# Patient Record
Sex: Female | Born: 1955 | Race: White | Hispanic: No | Marital: Married
Health system: Southern US, Community
[De-identification: ages and names within clinical notes are randomized; demographics above are authoritative.]

## PROBLEM LIST (undated history)

## (undated) DIAGNOSIS — J1282 Pneumonia due to coronavirus disease 2019: Secondary | ICD-10-CM

## (undated) DIAGNOSIS — J80 Acute respiratory distress syndrome: Secondary | ICD-10-CM

## (undated) DIAGNOSIS — U071 COVID-19: Secondary | ICD-10-CM

## (undated) DIAGNOSIS — J9621 Acute and chronic respiratory failure with hypoxia: Secondary | ICD-10-CM

---

## 2020-05-21 ENCOUNTER — Inpatient Hospital Stay
Admission: RE | Admit: 2020-05-21 | Discharge: 2020-07-08 | Disposition: A | Discharge status: Skilled Nursing Facility | Payer: Medicare HMO | Source: Other Acute Inpatient Hospital | Attending: Internal Medicine | Admitting: Internal Medicine

## 2020-05-21 ENCOUNTER — Other Ambulatory Visit (HOSPITAL_COMMUNITY): Payer: Medicare HMO

## 2020-05-21 DIAGNOSIS — Z9911 Dependence on respirator [ventilator] status: Secondary | ICD-10-CM

## 2020-05-21 DIAGNOSIS — R0902 Hypoxemia: Secondary | ICD-10-CM

## 2020-05-21 DIAGNOSIS — J1282 Pneumonia due to coronavirus disease 2019: Secondary | ICD-10-CM | POA: Diagnosis present

## 2020-05-21 DIAGNOSIS — J189 Pneumonia, unspecified organism: Secondary | ICD-10-CM

## 2020-05-21 DIAGNOSIS — J90 Pleural effusion, not elsewhere classified: Secondary | ICD-10-CM

## 2020-05-21 DIAGNOSIS — J9621 Acute and chronic respiratory failure with hypoxia: Secondary | ICD-10-CM | POA: Diagnosis present

## 2020-05-21 DIAGNOSIS — R000 Tachycardia, unspecified: Secondary | ICD-10-CM

## 2020-05-21 DIAGNOSIS — Z4659 Encounter for fitting and adjustment of other gastrointestinal appliance and device: Secondary | ICD-10-CM

## 2020-05-21 DIAGNOSIS — Z931 Gastrostomy status: Secondary | ICD-10-CM

## 2020-05-21 DIAGNOSIS — J9690 Respiratory failure, unspecified, unspecified whether with hypoxia or hypercapnia: Secondary | ICD-10-CM

## 2020-05-21 DIAGNOSIS — Z431 Encounter for attention to gastrostomy: Secondary | ICD-10-CM

## 2020-05-21 DIAGNOSIS — R1310 Dysphagia, unspecified: Secondary | ICD-10-CM

## 2020-05-21 DIAGNOSIS — T85598A Other mechanical complication of other gastrointestinal prosthetic devices, implants and grafts, initial encounter: Secondary | ICD-10-CM

## 2020-05-21 DIAGNOSIS — J80 Acute respiratory distress syndrome: Secondary | ICD-10-CM | POA: Diagnosis present

## 2020-05-21 DIAGNOSIS — U071 COVID-19: Secondary | ICD-10-CM | POA: Diagnosis present

## 2020-05-21 DIAGNOSIS — Z930 Tracheostomy status: Secondary | ICD-10-CM

## 2020-05-21 HISTORY — DX: Acute and chronic respiratory failure with hypoxia: J96.21

## 2020-05-21 HISTORY — DX: Acute respiratory distress syndrome: J80

## 2020-05-21 HISTORY — DX: COVID-19: U07.1

## 2020-05-21 HISTORY — DX: Pneumonia due to coronavirus disease 2019: J12.82

## 2020-05-21 LAB — URINALYSIS, ROUTINE W REFLEX MICROSCOPIC
Bacteria, UA: NONE SEEN
Bilirubin Urine: NEGATIVE
Glucose, UA: NEGATIVE mg/dL
Ketones, ur: NEGATIVE mg/dL
Leukocytes,Ua: NEGATIVE
Nitrite: NEGATIVE
Protein, ur: NEGATIVE mg/dL
Specific Gravity, Urine: 1.012 (ref 1.005–1.030)
pH: 7 (ref 5.0–8.0)

## 2020-05-21 LAB — BLOOD GAS, ARTERIAL
Acid-Base Excess: 9.5 mmol/L — ABNORMAL HIGH (ref 0.0–2.0)
Bicarbonate: 34.5 mmol/L — ABNORMAL HIGH (ref 20.0–28.0)
FIO2: 50
O2 Saturation: 97.8 %
Patient temperature: 36.6
pCO2 arterial: 54.7 mmHg — ABNORMAL HIGH (ref 32.0–48.0)
pH, Arterial: 7.413 (ref 7.350–7.450)
pO2, Arterial: 96.9 mmHg (ref 83.0–108.0)

## 2020-05-21 LAB — TSH: TSH: 2.544 u[IU]/mL (ref 0.350–4.500)

## 2020-05-21 MED ORDER — IOHEXOL 300 MG/ML  SOLN
50.0000 mL | Freq: Once | INTRAMUSCULAR | Status: AC | PRN
  Administered 2020-05-21: 30 mL

## 2020-05-22 DIAGNOSIS — Z9911 Dependence on respirator [ventilator] status: Secondary | ICD-10-CM | POA: Diagnosis not present

## 2020-05-22 DIAGNOSIS — U071 COVID-19: Secondary | ICD-10-CM | POA: Diagnosis not present

## 2020-05-22 DIAGNOSIS — J1282 Pneumonia due to coronavirus disease 2019: Secondary | ICD-10-CM

## 2020-05-22 DIAGNOSIS — J80 Acute respiratory distress syndrome: Secondary | ICD-10-CM

## 2020-05-22 DIAGNOSIS — J9621 Acute and chronic respiratory failure with hypoxia: Secondary | ICD-10-CM | POA: Diagnosis not present

## 2020-05-22 LAB — CBC WITH DIFFERENTIAL/PLATELET
Abs Immature Granulocytes: 0.05 10*3/uL (ref 0.00–0.07)
Basophils Absolute: 0.1 10*3/uL (ref 0.0–0.1)
Basophils Relative: 1 %
Eosinophils Absolute: 1.1 10*3/uL — ABNORMAL HIGH (ref 0.0–0.5)
Eosinophils Relative: 12 %
HCT: 32.8 % — ABNORMAL LOW (ref 36.0–46.0)
Hemoglobin: 10.1 g/dL — ABNORMAL LOW (ref 12.0–15.0)
Immature Granulocytes: 1 %
Lymphocytes Relative: 12 %
Lymphs Abs: 1.1 10*3/uL (ref 0.7–4.0)
MCH: 28.9 pg (ref 26.0–34.0)
MCHC: 30.8 g/dL (ref 30.0–36.0)
MCV: 93.7 fL (ref 80.0–100.0)
Monocytes Absolute: 0.5 10*3/uL (ref 0.1–1.0)
Monocytes Relative: 6 %
Neutro Abs: 6 10*3/uL (ref 1.7–7.7)
Neutrophils Relative %: 68 %
Platelets: 309 10*3/uL (ref 150–400)
RBC: 3.5 MIL/uL — ABNORMAL LOW (ref 3.87–5.11)
RDW: 17.2 % — ABNORMAL HIGH (ref 11.5–15.5)
WBC: 8.7 10*3/uL (ref 4.0–10.5)
nRBC: 0 % (ref 0.0–0.2)

## 2020-05-22 LAB — BLOOD GAS, ARTERIAL
Acid-Base Excess: 10.6 mmol/L — ABNORMAL HIGH (ref 0.0–2.0)
Bicarbonate: 36.5 mmol/L — ABNORMAL HIGH (ref 20.0–28.0)
FIO2: 50
O2 Saturation: 97.6 %
Patient temperature: 36.5
pCO2 arterial: 67.4 mmHg (ref 32.0–48.0)
pH, Arterial: 7.35 (ref 7.350–7.450)
pO2, Arterial: 95.1 mmHg (ref 83.0–108.0)

## 2020-05-22 LAB — COMPREHENSIVE METABOLIC PANEL
ALT: 34 U/L (ref 0–44)
AST: 28 U/L (ref 15–41)
Albumin: 2.5 g/dL — ABNORMAL LOW (ref 3.5–5.0)
Alkaline Phosphatase: 78 U/L (ref 38–126)
Anion gap: 9 (ref 5–15)
BUN: 16 mg/dL (ref 8–23)
CO2: 36 mmol/L — ABNORMAL HIGH (ref 22–32)
Calcium: 9.6 mg/dL (ref 8.9–10.3)
Chloride: 95 mmol/L — ABNORMAL LOW (ref 98–111)
Creatinine, Ser: 0.51 mg/dL (ref 0.44–1.00)
GFR, Estimated: >60 mL/min (ref >60)
Glucose, Bld: 129 mg/dL — ABNORMAL HIGH (ref 70–99)
Potassium: 4.2 mmol/L (ref 3.5–5.1)
Sodium: 140 mmol/L (ref 135–145)
Total Bilirubin: 0.6 mg/dL (ref 0.3–1.2)
Total Protein: 6.6 g/dL (ref 6.5–8.1)

## 2020-05-22 LAB — HEMOGLOBIN A1C
Hgb A1c MFr Bld: 4.9 % (ref 4.8–5.6)
Mean Plasma Glucose: 93.93 mg/dL

## 2020-05-22 LAB — PROTIME-INR
INR: 1.2 (ref 0.8–1.2)
Prothrombin Time: 14.7 seconds (ref 11.4–15.2)

## 2020-05-22 LAB — MAGNESIUM: Magnesium: 2 mg/dL (ref 1.7–2.4)

## 2020-05-22 LAB — PHOSPHORUS: Phosphorus: 4.7 mg/dL — ABNORMAL HIGH (ref 2.5–4.6)

## 2020-05-22 NOTE — Consult Note (Signed)
Pulmonary Critical Care Medicine [facility] [place]  PULMONARY SERVICE  Date of Service: 05/22/2020  PULMONARY CRITICAL CARE CONSULT   Andrea Fuller  [id number]:[id number]  DOB: 1955-11-15   DOA: 05/21/2020  Referring Physician: Carron Curie, MD  HPI: Andrea Fuller is a 65 y.o. female seen for follow up of Acute on Chronic Respiratory Failure.  Patient with multiple medical problems including hypertension came into the hospital because of cough and shortness of breath increasing shortness of breath with fevers and aches and pains.  Patient was apparently diagnosed with COVID-19 and became worse with development of ARDS.  Had a complicated course with worsening overall respiratory status ended up having to be intubated placed on mechanical ventilation subsequently was not able to come off of the ventilator and had to have a tracheostomy done.  Patient also developed critical care myopathy and has been significantly weak.  Transferred to our facility now for further management and weaning  Review of Systems:  ROS performed and is unremarkable other than noted above.  Diagnosis  . Primary osteoarthritis of knees, bilateral  . Morbid obesity (HCC)  . Essential hypertension  . Recurrent major depressive disorder, in partial remission (HCC)  . Chronic pain of both knees  . Class 3 severe obesity with body mass index (BMI) of 45.0 to 49.9 in adult [facility])  . Colonoscopy refused  . S/P hysterectomy for benign  . Mixed hyperlipidemia  . Pneumonia due to COVID-19 virus   Past Surgical History:  Procedure Laterality Date  . HYSTERECTOMY  . TUBAL LIGATION  . WRIST FRACTURE SURGERY   Family History Family History  Problem Relation Age of Onset  . Cancer Father  neuroendocrine carcinoma  . Anesthesia problems Neg Hx  . Arthritis Neg Hx  . Asthma Neg Hx   Socioeconomic History  . Marital status: Married  Spouse name: Not on file  . Number of children: Not on file  . Years of  education: Not on file  . Highest education level: Not on file  Occupational History  . Not on file  Tobacco Use  . Smoking status: Former Smoker  Quit date: 1985  Years since quitting: 36.9  . Smokeless tobacco: Never Used      Medications: Reviewed on Rounds  Physical Exam:  Vitals: Temperature is 97.8 pulse 70 respiratory 29 blood pressure 153/69 saturations 100%  Ventilator Settings on pressure control FiO2 50% IP 14 PEEP 5  . General: Comfortable at this time . Eyes: Grossly normal lids, irises & conjunctiva . ENT: grossly tongue is normal . Neck: no obvious mass . Cardiovascular: S1-S2 normal no gallop or rub . Respiratory: Scattered coarse rhonchi expansion equal . Abdomen: Soft and nontender . Skin: no rash seen on limited exam . Musculoskeletal: not rigid . Psychiatric:unable to assess . Neurologic: no seizure no involuntary movements         Labs on Admission:  Basic Metabolic Panel: Recent Labs  Lab 05/22/20 0557  NA 140  K 4.2  CL 95*  CO2 36*  GLUCOSE 129*  BUN 16  CREATININE 0.51  CALCIUM 9.6  MG 2.0  PHOS 4.7*    Recent Labs  Lab 05/21/20 1320 05/22/20 1315  PHART 7.413 7.350  PCO2ART 54.7* 67.4*  PO2ART 96.9 95.1  HCO3 34.5* 36.5*  O2SAT 97.8 97.6    Liver Function Tests: Recent Labs  Lab 05/22/20 0557  AST 28  ALT 34  ALKPHOS 78  BILITOT 0.6  PROT 6.6  ALBUMIN 2.5*   No  results for input(s): LIPASE, AMYLASE in the last 168 hours. No results for input(s): AMMONIA in the last 168 hours.  CBC: Recent Labs  Lab 05/22/20 0557  WBC 8.7  NEUTROABS 6.0  HGB 10.1*  HCT 32.8*  MCV 93.7  PLT 309    Cardiac Enzymes: No results for input(s): CKTOTAL, CKMB, CKMBINDEX, TROPONINI in the last 168 hours.  BNP (last 3 results) No results for input(s): BNP in the last 8760 hours.  ProBNP (last 3 results) No results for input(s): PROBNP in the last 8760 hours.   Radiological Exams on Admission: DG ABDOMEN PEG TUBE  LOCATION  Result Date: 05/21/2020 CLINICAL DATA:  Confirm percutaneous gastrostomy tube placement. EXAM: ABDOMEN - 1 VIEW 30 cc of Omnipaque 300 was administered per tube. COMPARISON:  None. FINDINGS: Percutaneous gastrostomy tube with retention balloon in the stomach. There is radiopaque contrast visualized in the stomach and proximal duodenum. The bowel gas pattern is normal. Thoracolumbar spondylosis. IMPRESSION: 1. Percutaneous gastrostomy tube within the stomach. 2. No evidence of bowel obstruction. Electronically Signed   By: Maudry Mayhew MD   On: 05/21/2020 14:40   DG CHEST PORT 1 VIEW  Result Date: 05/21/2020 CLINICAL DATA:  Tracheostomy, history of COVID EXAM: PORTABLE CHEST 1 VIEW COMPARISON:  05/01/2020 chest radiograph. FINDINGS: Tracheostomy tube tip overlies the tracheal air column 3.3 cm above the carina. Stable cardiomediastinal silhouette with normal heart size. No pneumothorax. No pleural effusion. Extensive patchy opacities throughout both lungs, slightly improved on the right and similar to slightly worsened on the left. IMPRESSION: 1. Well-positioned tracheostomy tube. 2. Extensive patchy opacities throughout both lungs, slightly improved on the right and similar to slightly worsened on the left. Findings presumably represent persistent sequela of severe COVID-19 infection. Electronically Signed   By: Delbert Phenix M.D.   On: 05/21/2020 14:45    Assessment/Plan Active Problems:   Acute on chronic respiratory failure with hypoxia (HCC)   COVID-19 virus infection   Pneumonia due to COVID-19 virus   Acute respiratory distress syndrome (ARDS) due to COVID-19 virus (HCC)   1. Acute on chronic respiratory failure hypoxia patient continues to be on the ventilator at this time right now is on pressure control mode requiring 50% FiO2.  Spoke with respiratory therapy to try to wean FiO2 down and reassess weaning potential. 2. COVID-19 virus infection in recovery we will continue with  supportive care.  Follow-up x-rays reveal bilateral interstitial infiltrates 3. COVID-19 pneumonia we will continue to follow patient has been treated for healthcare associated infections also.  We will do follow-up x-rays as warranted. 4. ARDS secondary to COVID-19 patient will need mechanical ventilation based on ARDS physiology.  We are hopeful that we can make attempts at weaning while patient is here but prognosis remains guarded  I have personally seen and evaluated the patient, evaluated laboratory and imaging results, formulated the assessment and plan and placed orders. The Patient requires high complexity decision making with multiple systems involvement.  Case was discussed on Rounds with the Respiratory Therapy Director and the Respiratory staff Time Spent  Yevonne Pax, MD [facility] Pulmonary Critical Care Medicine Sleep Medicine

## 2020-05-23 ENCOUNTER — Encounter: Payer: Self-pay | Admitting: Internal Medicine

## 2020-05-23 DIAGNOSIS — U071 COVID-19: Secondary | ICD-10-CM | POA: Diagnosis present

## 2020-05-23 DIAGNOSIS — J80 Acute respiratory distress syndrome: Secondary | ICD-10-CM | POA: Diagnosis not present

## 2020-05-23 DIAGNOSIS — Z9911 Dependence on respirator [ventilator] status: Secondary | ICD-10-CM | POA: Diagnosis not present

## 2020-05-23 DIAGNOSIS — J9621 Acute and chronic respiratory failure with hypoxia: Secondary | ICD-10-CM | POA: Diagnosis present

## 2020-05-23 DIAGNOSIS — J1282 Pneumonia due to coronavirus disease 2019: Secondary | ICD-10-CM | POA: Diagnosis present

## 2020-05-23 LAB — MAGNESIUM: Magnesium: 1.9 mg/dL (ref 1.7–2.4)

## 2020-05-23 LAB — CBC
HCT: 30.2 % — ABNORMAL LOW (ref 36.0–46.0)
Hemoglobin: 9.2 g/dL — ABNORMAL LOW (ref 12.0–15.0)
MCH: 28.6 pg (ref 26.0–34.0)
MCHC: 30.5 g/dL (ref 30.0–36.0)
MCV: 93.8 fL (ref 80.0–100.0)
Platelets: 303 10*3/uL (ref 150–400)
RBC: 3.22 MIL/uL — ABNORMAL LOW (ref 3.87–5.11)
RDW: 17.2 % — ABNORMAL HIGH (ref 11.5–15.5)
WBC: 9.9 10*3/uL (ref 4.0–10.5)
nRBC: 0 % (ref 0.0–0.2)

## 2020-05-23 LAB — BASIC METABOLIC PANEL
Anion gap: 11 (ref 5–15)
BUN: 17 mg/dL (ref 8–23)
CO2: 34 mmol/L — ABNORMAL HIGH (ref 22–32)
Calcium: 9.5 mg/dL (ref 8.9–10.3)
Chloride: 94 mmol/L — ABNORMAL LOW (ref 98–111)
Creatinine, Ser: 0.48 mg/dL (ref 0.44–1.00)
GFR, Estimated: >60 mL/min (ref >60)
Glucose, Bld: 121 mg/dL — ABNORMAL HIGH (ref 70–99)
Potassium: 4.3 mmol/L (ref 3.5–5.1)
Sodium: 139 mmol/L (ref 135–145)

## 2020-05-23 LAB — BLOOD GAS, ARTERIAL
Acid-Base Excess: 13.4 mmol/L — ABNORMAL HIGH (ref 0.0–2.0)
Bicarbonate: 38.8 mmol/L — ABNORMAL HIGH (ref 20.0–28.0)
Drawn by: 34762
FIO2: 50
O2 Saturation: 98.8 %
Patient temperature: 37.1
pCO2 arterial: 63.9 mmHg — ABNORMAL HIGH (ref 32.0–48.0)
pH, Arterial: 7.401 (ref 7.350–7.450)
pO2, Arterial: 116 mmHg — ABNORMAL HIGH (ref 83.0–108.0)

## 2020-05-23 LAB — PHOSPHORUS: Phosphorus: 4.1 mg/dL (ref 2.5–4.6)

## 2020-05-23 LAB — URINE CULTURE: Culture: <10000 — AB

## 2020-05-23 NOTE — Progress Notes (Signed)
Pulmonary Critical Care Medicine [facility] [place]   PULMONARY CRITICAL CARE SERVICE  PROGRESS NOTE  Date of Service: 05/23/2020  Andrea Fuller  QQP:[id number]  DOB: Sep 19, 1955   DOA: 05/21/2020  Referring Physician: Carron Curie, MD  HPI: Andrea Fuller is a 65 y.o. female seen for follow up of Acute on Chronic Respiratory Failure.  Patient currently is on pressure control mode has been on 50% FiO2 with a iP of 14  Medications: Reviewed on Rounds  Physical Exam:  Vitals: Temperature 98.9 pulse 64 respiratory rate 30 blood pressure is 108/42 saturations 96%  Ventilator Settings on pressure assist control FiO2 50% IP 14 PEEP 5  . General: Comfortable at this time . Eyes: Grossly normal lids, irises & conjunctiva . ENT: grossly tongue is normal . Neck: no obvious mass . Cardiovascular: S1 S2 normal no gallop . Respiratory: No rhonchi coarse breath sounds . Abdomen: soft . Skin: no rash seen on limited exam . Musculoskeletal: not rigid . Psychiatric:unable to assess . Neurologic: no seizure no involuntary movements         Lab Data:   Basic Metabolic Panel: Recent Labs  Lab 05/22/20 0557 05/23/20 0614  NA 140 139  K 4.2 4.3  CL 95* 94*  CO2 36* 34*  GLUCOSE 129* 121*  BUN 16 17  CREATININE 0.51 0.48  CALCIUM 9.6 9.5  MG 2.0 1.9  PHOS 4.7* 4.1    ABG: Recent Labs  Lab 05/21/20 1320 05/22/20 1315 05/23/20 0601  PHART 7.413 7.350 7.401  PCO2ART 54.7* 67.4* 63.9*  PO2ART 96.9 95.1 116*  HCO3 34.5* 36.5* 38.8*  O2SAT 97.8 97.6 98.8    Liver Function Tests: Recent Labs  Lab 05/22/20 0557  AST 28  ALT 34  ALKPHOS 78  BILITOT 0.6  PROT 6.6  ALBUMIN 2.5*   No results for input(s): LIPASE, AMYLASE in the last 168 hours. No results for input(s): AMMONIA in the last 168 hours.  CBC: Recent Labs  Lab 05/22/20 0557 05/23/20 0614  WBC 8.7 9.9  NEUTROABS 6.0  --   HGB 10.1* 9.2*  HCT 32.8* 30.2*  MCV 93.7 93.8  PLT 309 303    Cardiac  Enzymes: No results for input(s): CKTOTAL, CKMB, CKMBINDEX, TROPONINI in the last 168 hours.  BNP (last 3 results) No results for input(s): BNP in the last 8760 hours.  ProBNP (last 3 results) No results for input(s): PROBNP in the last 8760 hours.  Radiological Exams: DG ABDOMEN PEG TUBE LOCATION  Result Date: 05/21/2020 CLINICAL DATA:  Confirm percutaneous gastrostomy tube placement. EXAM: ABDOMEN - 1 VIEW 30 cc of Omnipaque 300 was administered per tube. COMPARISON:  None. FINDINGS: Percutaneous gastrostomy tube with retention balloon in the stomach. There is radiopaque contrast visualized in the stomach and proximal duodenum. The bowel gas pattern is normal. Thoracolumbar spondylosis. IMPRESSION: 1. Percutaneous gastrostomy tube within the stomach. 2. No evidence of bowel obstruction. Electronically Signed   By: Maudry Mayhew MD   On: 05/21/2020 14:40   DG CHEST PORT 1 VIEW  Result Date: 05/21/2020 CLINICAL DATA:  Tracheostomy, history of COVID EXAM: PORTABLE CHEST 1 VIEW COMPARISON:  05/01/2020 chest radiograph. FINDINGS: Tracheostomy tube tip overlies the tracheal air column 3.3 cm above the carina. Stable cardiomediastinal silhouette with normal heart size. No pneumothorax. No pleural effusion. Extensive patchy opacities throughout both lungs, slightly improved on the right and similar to slightly worsened on the left. IMPRESSION: 1. Well-positioned tracheostomy tube. 2. Extensive patchy opacities throughout both lungs, slightly improved  on the right and similar to slightly worsened on the left. Findings presumably represent persistent sequela of severe COVID-19 infection. Electronically Signed   By: Delbert Phenix M.D.   On: 05/21/2020 14:45    Assessment/Plan Active Problems:   Acute on chronic respiratory failure with hypoxia (HCC)   COVID-19 virus infection   Pneumonia due to COVID-19 virus   Acute respiratory distress syndrome (ARDS) due to COVID-19 virus (HCC)   1. Acute on  chronic respiratory failure with hypoxia we will continue with the pressure control mode Right now for port try to wean FiO2 down however 2. COVID-19 virus infection recovery 3. Pneumonia due to COVID-19 treated 4. ARDS treated slowly improving   I have personally seen and evaluated the patient, evaluated laboratory and imaging results, formulated the assessment and plan and placed orders. The Patient requires high complexity decision making with multiple systems involvement.  Rounds were done with the Respiratory Therapy Director and Staff therapists and discussed with nursing staff also.  Yevonne Pax, MD [facility] Pulmonary Critical Care Medicine Sleep Medicine

## 2020-05-24 ENCOUNTER — Other Ambulatory Visit (HOSPITAL_COMMUNITY): Payer: Medicare HMO

## 2020-05-24 DIAGNOSIS — Z9911 Dependence on respirator [ventilator] status: Secondary | ICD-10-CM | POA: Diagnosis not present

## 2020-05-24 DIAGNOSIS — U071 COVID-19: Secondary | ICD-10-CM | POA: Diagnosis not present

## 2020-05-24 DIAGNOSIS — J9621 Acute and chronic respiratory failure with hypoxia: Secondary | ICD-10-CM | POA: Diagnosis not present

## 2020-05-24 DIAGNOSIS — J80 Acute respiratory distress syndrome: Secondary | ICD-10-CM | POA: Diagnosis not present

## 2020-05-24 LAB — CBC
HCT: 32.3 % — ABNORMAL LOW (ref 36.0–46.0)
Hemoglobin: 10.2 g/dL — ABNORMAL LOW (ref 12.0–15.0)
MCH: 29.1 pg (ref 26.0–34.0)
MCHC: 31.6 g/dL (ref 30.0–36.0)
MCV: 92.3 fL (ref 80.0–100.0)
Platelets: 320 10*3/uL (ref 150–400)
RBC: 3.5 MIL/uL — ABNORMAL LOW (ref 3.87–5.11)
RDW: 17 % — ABNORMAL HIGH (ref 11.5–15.5)
WBC: 12.8 10*3/uL — ABNORMAL HIGH (ref 4.0–10.5)
nRBC: 0 % (ref 0.0–0.2)

## 2020-05-24 LAB — BASIC METABOLIC PANEL
Anion gap: 14 (ref 5–15)
BUN: 21 mg/dL (ref 8–23)
CO2: 33 mmol/L — ABNORMAL HIGH (ref 22–32)
Calcium: 9.7 mg/dL (ref 8.9–10.3)
Chloride: 92 mmol/L — ABNORMAL LOW (ref 98–111)
Creatinine, Ser: 0.54 mg/dL (ref 0.44–1.00)
GFR, Estimated: >60 mL/min (ref >60)
Glucose, Bld: 138 mg/dL — ABNORMAL HIGH (ref 70–99)
Potassium: 4.3 mmol/L (ref 3.5–5.1)
Sodium: 139 mmol/L (ref 135–145)

## 2020-05-24 LAB — CULTURE, RESPIRATORY W GRAM STAIN: Culture: NORMAL

## 2020-05-24 LAB — PHOSPHORUS: Phosphorus: 4.7 mg/dL — ABNORMAL HIGH (ref 2.5–4.6)

## 2020-05-24 LAB — MAGNESIUM: Magnesium: 1.9 mg/dL (ref 1.7–2.4)

## 2020-05-24 NOTE — Progress Notes (Signed)
Pulmonary Critical Care Medicine [facility] [place]   PULMONARY CRITICAL CARE SERVICE  PROGRESS NOTE  Date of Service: 05/24/2020  Elide Stalzer  RJJ:[id number]  DOB: March 16, 1956   DOA: 05/21/2020  Referring Physician: Carron Curie, MD  HPI: Kameka Whan is a 65 y.o. female seen for follow up of Acute on Chronic Respiratory Failure.  Patient is on pressure control mode currently on 40% FiO2 PEEP 14 PEEP 5  Medications: Reviewed on Rounds  Physical Exam:  Vitals: Temperature is 96.8 pulse 66 respiratory rate 35 blood pressure is 141/65 saturations 98%  Ventilator Settings on pressure assist control FiO2 40%  . General: Comfortable at this time . Eyes: Grossly normal lids, irises & conjunctiva . ENT: grossly tongue is normal . Neck: no obvious mass . Cardiovascular: S1 S2 normal no gallop . Respiratory: Scattered rhonchi expansion is equal . Abdomen: soft . Skin: no rash seen on limited exam . Musculoskeletal: not rigid . Psychiatric:unable to assess . Neurologic: no seizure no involuntary movements         Lab Data:   Basic Metabolic Panel: Recent Labs  Lab 05/22/20 0557 05/23/20 0614 05/24/20 0546  NA 140 139 139  K 4.2 4.3 4.3  CL 95* 94* 92*  CO2 36* 34* 33*  GLUCOSE 129* 121* 138*  BUN 16 17 21   CREATININE 0.51 0.48 0.54  CALCIUM 9.6 9.5 9.7  MG 2.0 1.9 1.9  PHOS 4.7* 4.1 4.7*    ABG: Recent Labs  Lab 05/21/20 1320 05/22/20 1315 05/23/20 0601  PHART 7.413 7.350 7.401  PCO2ART 54.7* 67.4* 63.9*  PO2ART 96.9 95.1 116*  HCO3 34.5* 36.5* 38.8*  O2SAT 97.8 97.6 98.8    Liver Function Tests: Recent Labs  Lab 05/22/20 0557  AST 28  ALT 34  ALKPHOS 78  BILITOT 0.6  PROT 6.6  ALBUMIN 2.5*   No results for input(s): LIPASE, AMYLASE in the last 168 hours. No results for input(s): AMMONIA in the last 168 hours.  CBC: Recent Labs  Lab 05/22/20 0557 05/23/20 0614 05/24/20 0546  WBC 8.7 9.9 12.8*  NEUTROABS 6.0  --   --   HGB  10.1* 9.2* 10.2*  HCT 32.8* 30.2* 32.3*  MCV 93.7 93.8 92.3  PLT 309 303 320    Cardiac Enzymes: No results for input(s): CKTOTAL, CKMB, CKMBINDEX, TROPONINI in the last 168 hours.  BNP (last 3 results) No results for input(s): BNP in the last 8760 hours.  ProBNP (last 3 results) No results for input(s): PROBNP in the last 8760 hours.  Radiological Exams: DG Chest Port 1 View  Result Date: 05/24/2020 CLINICAL DATA:  Encounter for respiratory failure, ventilator dependent. EXAM: PORTABLE CHEST 1 VIEW COMPARISON:  Three days ago FINDINGS: Cardiomegaly and vascular pedicle widening. Tracheostomy tube in place. Confluent airspace disease that is unchanged. No visible effusion or pneumothorax. IMPRESSION: Stable confluent pneumonia. Electronically Signed   By: 05/26/2020 M.D.   On: 05/24/2020 07:55    Assessment/Plan Active Problems:   Acute on chronic respiratory failure with hypoxia (HCC)   COVID-19 virus infection   Pneumonia due to COVID-19 virus   Acute respiratory distress syndrome (ARDS) due to COVID-19 virus (HCC)   1. Acute on chronic respiratory failure hypoxia we will continue with pressure control titrate oxygen continue pulmonary toilet. 2. COVID-19 virus infection recovery phase 3. Pneumonia due to COVID-19 treated 4. ARDS slow to improve   I have personally seen and evaluated the patient, evaluated laboratory and imaging results, formulated the assessment  and plan and placed orders. The Patient requires high complexity decision making with multiple systems involvement.  Rounds were done with the Respiratory Therapy Director and Staff therapists and discussed with nursing staff also.  Allyne Gee, MD [facility] Pulmonary Critical Care Medicine Sleep Medicine

## 2020-05-25 DIAGNOSIS — U071 COVID-19: Secondary | ICD-10-CM | POA: Diagnosis not present

## 2020-05-25 DIAGNOSIS — Z9911 Dependence on respirator [ventilator] status: Secondary | ICD-10-CM | POA: Diagnosis not present

## 2020-05-25 DIAGNOSIS — J9621 Acute and chronic respiratory failure with hypoxia: Secondary | ICD-10-CM | POA: Diagnosis not present

## 2020-05-25 DIAGNOSIS — J80 Acute respiratory distress syndrome: Secondary | ICD-10-CM | POA: Diagnosis not present

## 2020-05-25 NOTE — Progress Notes (Signed)
Pulmonary Critical Care Medicine [facility] [place]   PULMONARY CRITICAL CARE SERVICE  PROGRESS NOTE  Date of Service: 05/25/2020  Andrea Fuller  [id number]  DOB: 13-May-1955   DOA: 05/21/2020  Referring Physician: Carron Curie, MD  HPI: Andrea Fuller is a 65 y.o. female seen for follow up of Acute on Chronic Respiratory Failure.  Patient is on full support on pressure control mode currently has been on 40% FiO2 with a PEEP of 5  Medications: Reviewed on Rounds  Physical Exam:  Vitals: Temperature is 97.8 pulse 98 respiratory rate 29 blood pressure is 138/89 saturations 98%  Ventilator Settings on pressure assist control FiO2 40% inspiratory pressure of 14 with a PEEP of 5  . General: Comfortable at this time . Eyes: Grossly normal lids, irises & conjunctiva . ENT: grossly tongue is normal . Neck: no obvious mass . Cardiovascular: S1 S2 normal no gallop . Respiratory: Scattered rhonchi coarse breath sound . Abdomen: soft . Skin: no rash seen on limited exam . Musculoskeletal: not rigid . Psychiatric:unable to assess . Neurologic: no seizure no involuntary movements         Lab Data:   Basic Metabolic Panel: Recent Labs  Lab 05/22/20 0557 05/23/20 0614 05/24/20 0546  NA 140 139 139  K 4.2 4.3 4.3  CL 95* 94* 92*  CO2 36* 34* 33*  GLUCOSE 129* 121* 138*  BUN 16 17 21   CREATININE 0.51 0.48 0.54  CALCIUM 9.6 9.5 9.7  MG 2.0 1.9 1.9  PHOS 4.7* 4.1 4.7*    ABG: Recent Labs  Lab 05/21/20 1320 05/22/20 1315 05/23/20 0601  PHART 7.413 7.350 7.401  PCO2ART 54.7* 67.4* 63.9*  PO2ART 96.9 95.1 116*  HCO3 34.5* 36.5* 38.8*  O2SAT 97.8 97.6 98.8    Liver Function Tests: Recent Labs  Lab 05/22/20 0557  AST 28  ALT 34  ALKPHOS 78  BILITOT 0.6  PROT 6.6  ALBUMIN 2.5*   No results for input(s): LIPASE, AMYLASE in the last 168 hours. No results for input(s): AMMONIA in the last 168 hours.  CBC: Recent Labs  Lab 05/22/20 0557 05/23/20 0614  05/24/20 0546  WBC 8.7 9.9 12.8*  NEUTROABS 6.0  --   --   HGB 10.1* 9.2* 10.2*  HCT 32.8* 30.2* 32.3*  MCV 93.7 93.8 92.3  PLT 309 303 320    Cardiac Enzymes: No results for input(s): CKTOTAL, CKMB, CKMBINDEX, TROPONINI in the last 168 hours.  BNP (last 3 results) No results for input(s): BNP in the last 8760 hours.  ProBNP (last 3 results) No results for input(s): PROBNP in the last 8760 hours.  Radiological Exams: DG Chest Port 1 View  Result Date: 05/24/2020 CLINICAL DATA:  Encounter for respiratory failure, ventilator dependent. EXAM: PORTABLE CHEST 1 VIEW COMPARISON:  Three days ago FINDINGS: Cardiomegaly and vascular pedicle widening. Tracheostomy tube in place. Confluent airspace disease that is unchanged. No visible effusion or pneumothorax. IMPRESSION: Stable confluent pneumonia. Electronically Signed   By: 05/26/2020 M.D.   On: 05/24/2020 07:55    Assessment/Plan Active Problems:   Acute on chronic respiratory failure with hypoxia (HCC)   COVID-19 virus infection   Pneumonia due to COVID-19 virus   Acute respiratory distress syndrome (ARDS) due to COVID-19 virus (HCC)   1. Acute on chronic respiratory failure with hypoxia we will continue with full support on the ventilator.  Respiratory therapy will assess the RSB I mechanics. 2. COVID-19 virus infection recovery 3. Pneumonia due to COVID-19 treated with  slow improvement 4. ARDS still has significant residual pulmonary changes noted on the chest film   I have personally seen and evaluated the patient, evaluated laboratory and imaging results, formulated the assessment and plan and placed orders. The Patient requires high complexity decision making with multiple systems involvement.  Rounds were done with the Respiratory Therapy Director and Staff therapists and discussed with nursing staff also.  Yevonne Pax, MD [facility] Pulmonary Critical Care Medicine Sleep Medicine

## 2020-05-26 DIAGNOSIS — J9621 Acute and chronic respiratory failure with hypoxia: Secondary | ICD-10-CM | POA: Diagnosis not present

## 2020-05-26 DIAGNOSIS — Z9911 Dependence on respirator [ventilator] status: Secondary | ICD-10-CM | POA: Diagnosis not present

## 2020-05-26 DIAGNOSIS — J80 Acute respiratory distress syndrome: Secondary | ICD-10-CM | POA: Diagnosis not present

## 2020-05-26 DIAGNOSIS — U071 COVID-19: Secondary | ICD-10-CM | POA: Diagnosis not present

## 2020-05-26 LAB — URINALYSIS, ROUTINE W REFLEX MICROSCOPIC
Bilirubin Urine: NEGATIVE
Glucose, UA: NEGATIVE mg/dL
Hgb urine dipstick: NEGATIVE
Ketones, ur: NEGATIVE mg/dL
Nitrite: POSITIVE — AB
Protein, ur: 30 mg/dL — AB
Specific Gravity, Urine: 1.021 (ref 1.005–1.030)
pH: 5 (ref 5.0–8.0)

## 2020-05-26 NOTE — Progress Notes (Signed)
Pulmonary Critical Care Medicine [facility] [place]   PULMONARY CRITICAL CARE SERVICE  PROGRESS NOTE  Date of Service: 05/26/2020  Andrea Fuller  TMA:[id number]  DOB: 07-19-1955   DOA: 05/21/2020  Referring Physician: Carron Curie, MD  HPI: Andrea Fuller is a 65 y.o. female seen for follow up of Acute on Chronic Respiratory Failure.  Patient remains on the ventilator and full support.  Has been on 40% FiO2  Medications: Reviewed on Rounds  Physical Exam:  Vitals: Temperature is 98.7 pulse 89 respiratory rate is 29 blood pressure 130/90 saturations 99%  Ventilator Settings on pressure assist control FiO2 40% IP 14 PEEP 5  . General: Comfortable at this time . Eyes: Grossly normal lids, irises & conjunctiva . ENT: grossly tongue is normal . Neck: no obvious mass . Cardiovascular: S1 S2 normal no gallop . Respiratory: No rhonchi coarse breath sounds . Abdomen: soft . Skin: no rash seen on limited exam . Musculoskeletal: not rigid . Psychiatric:unable to assess . Neurologic: no seizure no involuntary movements         Lab Data:   Basic Metabolic Panel: Recent Labs  Lab 05/22/20 0557 05/23/20 0614 05/24/20 0546  NA 140 139 139  K 4.2 4.3 4.3  CL 95* 94* 92*  CO2 36* 34* 33*  GLUCOSE 129* 121* 138*  BUN 16 17 21   CREATININE 0.51 0.48 0.54  CALCIUM 9.6 9.5 9.7  MG 2.0 1.9 1.9  PHOS 4.7* 4.1 4.7*    ABG: Recent Labs  Lab 05/21/20 1320 05/22/20 1315 05/23/20 0601  PHART 7.413 7.350 7.401  PCO2ART 54.7* 67.4* 63.9*  PO2ART 96.9 95.1 116*  HCO3 34.5* 36.5* 38.8*  O2SAT 97.8 97.6 98.8    Liver Function Tests: Recent Labs  Lab 05/22/20 0557  AST 28  ALT 34  ALKPHOS 78  BILITOT 0.6  PROT 6.6  ALBUMIN 2.5*   No results for input(s): LIPASE, AMYLASE in the last 168 hours. No results for input(s): AMMONIA in the last 168 hours.  CBC: Recent Labs  Lab 05/22/20 0557 05/23/20 0614 05/24/20 0546  WBC 8.7 9.9 12.8*  NEUTROABS 6.0  --   --    HGB 10.1* 9.2* 10.2*  HCT 32.8* 30.2* 32.3*  MCV 93.7 93.8 92.3  PLT 309 303 320    Cardiac Enzymes: No results for input(s): CKTOTAL, CKMB, CKMBINDEX, TROPONINI in the last 168 hours.  BNP (last 3 results) No results for input(s): BNP in the last 8760 hours.  ProBNP (last 3 results) No results for input(s): PROBNP in the last 8760 hours.  Radiological Exams: No results found.  Assessment/Plan Active Problems:   Acute on chronic respiratory failure with hypoxia (HCC)   COVID-19 virus infection   Pneumonia due to COVID-19 virus   Acute respiratory distress syndrome (ARDS) due to COVID-19 virus (HCC)   1. Acute on chronic respiratory failure hypoxia we will try to advance the weaning and try pressure support will continue to advance as tolerated 2. COVID-19 virus infection recovery 3. Pneumonia due to COVID-19 treated 4. ARDS due to COVID-19 treated we will continue with supportive care   I have personally seen and evaluated the patient, evaluated laboratory and imaging results, formulated the assessment and plan and placed orders. The Patient requires high complexity decision making with multiple systems involvement.  Rounds were done with the Respiratory Therapy Director and Staff therapists and discussed with nursing staff also.  05/26/20, MD [facility] Pulmonary Critical Care Medicine Sleep Medicine

## 2020-05-27 ENCOUNTER — Other Ambulatory Visit (HOSPITAL_COMMUNITY): Payer: Medicare HMO

## 2020-05-27 DIAGNOSIS — U071 COVID-19: Secondary | ICD-10-CM | POA: Diagnosis not present

## 2020-05-27 DIAGNOSIS — J80 Acute respiratory distress syndrome: Secondary | ICD-10-CM | POA: Diagnosis not present

## 2020-05-27 DIAGNOSIS — J9621 Acute and chronic respiratory failure with hypoxia: Secondary | ICD-10-CM | POA: Diagnosis not present

## 2020-05-27 DIAGNOSIS — Z9911 Dependence on respirator [ventilator] status: Secondary | ICD-10-CM | POA: Diagnosis not present

## 2020-05-27 LAB — BASIC METABOLIC PANEL
Anion gap: 12 (ref 5–15)
BUN: 45 mg/dL — ABNORMAL HIGH (ref 8–23)
CO2: 34 mmol/L — ABNORMAL HIGH (ref 22–32)
Calcium: 9.7 mg/dL (ref 8.9–10.3)
Chloride: 93 mmol/L — ABNORMAL LOW (ref 98–111)
Creatinine, Ser: 0.78 mg/dL (ref 0.44–1.00)
GFR, Estimated: >60 mL/min (ref >60)
Glucose, Bld: 142 mg/dL — ABNORMAL HIGH (ref 70–99)
Potassium: 4.2 mmol/L (ref 3.5–5.1)
Sodium: 139 mmol/L (ref 135–145)

## 2020-05-27 LAB — CBC
HCT: 27.4 % — ABNORMAL LOW (ref 36.0–46.0)
Hemoglobin: 8.3 g/dL — ABNORMAL LOW (ref 12.0–15.0)
MCH: 28.1 pg (ref 26.0–34.0)
MCHC: 30.3 g/dL (ref 30.0–36.0)
MCV: 92.9 fL (ref 80.0–100.0)
Platelets: 189 10*3/uL (ref 150–400)
RBC: 2.95 MIL/uL — ABNORMAL LOW (ref 3.87–5.11)
RDW: 17.1 % — ABNORMAL HIGH (ref 11.5–15.5)
WBC: 16.5 10*3/uL — ABNORMAL HIGH (ref 4.0–10.5)
nRBC: 0 % (ref 0.0–0.2)

## 2020-05-27 LAB — MAGNESIUM: Magnesium: 2.1 mg/dL (ref 1.7–2.4)

## 2020-05-27 LAB — PHOSPHORUS: Phosphorus: 4.5 mg/dL (ref 2.5–4.6)

## 2020-05-27 NOTE — Progress Notes (Signed)
Pulmonary Critical Care Medicine [facility] [place]   PULMONARY CRITICAL CARE SERVICE  PROGRESS NOTE  Date of Service: 05/27/2020  Andrea Fuller  VHQ:[id number]  DOB: 1955/09/24   DOA: 05/21/2020  Referring Physician: Carron Curie, MD  HPI: Andrea Fuller is a 65 y.o. female seen for follow up of Acute on Chronic Respiratory Failure.  Patient at this time is on pressure assist control on 40% FiO2 good saturations noted  Medications: Reviewed on Rounds  Physical Exam:  Vitals: Temperature is 99.6 pulse 77 respiratory 20 blood pressure is 95/53 saturations 98%  Ventilator Settings on pressure assist control FiO2 40% IP 14 PEEP 5  . General: Comfortable at this time . Eyes: Grossly normal lids, irises & conjunctiva . ENT: grossly tongue is normal . Neck: no obvious mass . Cardiovascular: S1 S2 normal no gallop . Respiratory: Scattered rhonchi expansion is equal . Abdomen: soft . Skin: no rash seen on limited exam . Musculoskeletal: not rigid . Psychiatric:unable to assess . Neurologic: no seizure no involuntary movements         Lab Data:   Basic Metabolic Panel: Recent Labs  Lab 05/22/20 0557 05/23/20 0614 05/24/20 0546 05/27/20 0504  NA 140 139 139 139  K 4.2 4.3 4.3 4.2  CL 95* 94* 92* 93*  CO2 36* 34* 33* 34*  GLUCOSE 129* 121* 138* 142*  BUN 16 17 21  45*  CREATININE 0.51 0.48 0.54 0.78  CALCIUM 9.6 9.5 9.7 9.7  MG 2.0 1.9 1.9 2.1  PHOS 4.7* 4.1 4.7* 4.5    ABG: Recent Labs  Lab 05/21/20 1320 05/22/20 1315 05/23/20 0601  PHART 7.413 7.350 7.401  PCO2ART 54.7* 67.4* 63.9*  PO2ART 96.9 95.1 116*  HCO3 34.5* 36.5* 38.8*  O2SAT 97.8 97.6 98.8    Liver Function Tests: Recent Labs  Lab 05/22/20 0557  AST 28  ALT 34  ALKPHOS 78  BILITOT 0.6  PROT 6.6  ALBUMIN 2.5*   No results for input(s): LIPASE, AMYLASE in the last 168 hours. No results for input(s): AMMONIA in the last 168 hours.  CBC: Recent Labs  Lab 05/22/20 0557  05/23/20 0614 05/24/20 0546 05/27/20 0504  WBC 8.7 9.9 12.8* 16.5*  NEUTROABS 6.0  --   --   --   HGB 10.1* 9.2* 10.2* 8.3*  HCT 32.8* 30.2* 32.3* 27.4*  MCV 93.7 93.8 92.3 92.9  PLT 309 303 320 189    Cardiac Enzymes: No results for input(s): CKTOTAL, CKMB, CKMBINDEX, TROPONINI in the last 168 hours.  BNP (last 3 results) No results for input(s): BNP in the last 8760 hours.  ProBNP (last 3 results) No results for input(s): PROBNP in the last 8760 hours.  Radiological Exams: No results found.  Assessment/Plan Active Problems:   Acute on chronic respiratory failure with hypoxia (HCC)   COVID-19 virus infection   Pneumonia due to COVID-19 virus   Acute respiratory distress syndrome (ARDS) due to COVID-19 virus (HCC)   1. Acute on chronic respiratory failure with hypoxia continue with pressure control mode titrate oxygen continue pulmonary toilet. 2. COVID-19 virus infection recovery 3. Pneumonia due to COVID-19 treated 4. ARDS treated we will continue to follow   I have personally seen and evaluated the patient, evaluated laboratory and imaging results, formulated the assessment and plan and placed orders. The Patient requires high complexity decision making with multiple systems involvement.  Rounds were done with the Respiratory Therapy Director and Staff therapists and discussed with nursing staff also.  05/29/20,  MD [facility] Pulmonary Critical Care Medicine Sleep Medicine

## 2020-05-28 LAB — CBC
HCT: 27.9 % — ABNORMAL LOW (ref 36.0–46.0)
Hemoglobin: 8 g/dL — ABNORMAL LOW (ref 12.0–15.0)
MCH: 27.4 pg (ref 26.0–34.0)
MCHC: 28.7 g/dL — ABNORMAL LOW (ref 30.0–36.0)
MCV: 95.5 fL (ref 80.0–100.0)
Platelets: 159 10*3/uL (ref 150–400)
RBC: 2.92 MIL/uL — ABNORMAL LOW (ref 3.87–5.11)
RDW: 17 % — ABNORMAL HIGH (ref 11.5–15.5)
WBC: 10.8 10*3/uL — ABNORMAL HIGH (ref 4.0–10.5)
nRBC: 0 % (ref 0.0–0.2)

## 2020-05-28 LAB — CULTURE, RESPIRATORY W GRAM STAIN

## 2020-05-28 LAB — VANCOMYCIN, TROUGH: Vancomycin Tr: 15 ug/mL (ref 15–20)

## 2020-05-29 ENCOUNTER — Other Ambulatory Visit (HOSPITAL_COMMUNITY): Payer: Medicare HMO

## 2020-05-29 DIAGNOSIS — Z9911 Dependence on respirator [ventilator] status: Secondary | ICD-10-CM | POA: Diagnosis not present

## 2020-05-29 DIAGNOSIS — J80 Acute respiratory distress syndrome: Secondary | ICD-10-CM | POA: Diagnosis not present

## 2020-05-29 DIAGNOSIS — J9621 Acute and chronic respiratory failure with hypoxia: Secondary | ICD-10-CM | POA: Diagnosis not present

## 2020-05-29 DIAGNOSIS — U071 COVID-19: Secondary | ICD-10-CM | POA: Diagnosis not present

## 2020-05-29 LAB — RENAL FUNCTION PANEL
Albumin: 1.8 g/dL — ABNORMAL LOW (ref 3.5–5.0)
Anion gap: 11 (ref 5–15)
BUN: 21 mg/dL (ref 8–23)
CO2: 29 mmol/L (ref 22–32)
Calcium: 8.7 mg/dL — ABNORMAL LOW (ref 8.9–10.3)
Chloride: 100 mmol/L (ref 98–111)
Creatinine, Ser: 0.47 mg/dL (ref 0.44–1.00)
GFR, Estimated: >60 mL/min (ref >60)
Glucose, Bld: 119 mg/dL — ABNORMAL HIGH (ref 70–99)
Phosphorus: 3.5 mg/dL (ref 2.5–4.6)
Potassium: 4 mmol/L (ref 3.5–5.1)
Sodium: 140 mmol/L (ref 135–145)

## 2020-05-29 LAB — CBC
HCT: 27.3 % — ABNORMAL LOW (ref 36.0–46.0)
Hemoglobin: 7.8 g/dL — ABNORMAL LOW (ref 12.0–15.0)
MCH: 27.2 pg (ref 26.0–34.0)
MCHC: 28.6 g/dL — ABNORMAL LOW (ref 30.0–36.0)
MCV: 95.1 fL (ref 80.0–100.0)
Platelets: 176 10*3/uL (ref 150–400)
RBC: 2.87 MIL/uL — ABNORMAL LOW (ref 3.87–5.11)
RDW: 16.9 % — ABNORMAL HIGH (ref 11.5–15.5)
WBC: 6.2 10*3/uL (ref 4.0–10.5)
nRBC: 0 % (ref 0.0–0.2)

## 2020-05-29 LAB — URINE CULTURE: Culture: >=100000 — AB

## 2020-05-29 NOTE — Progress Notes (Signed)
Pulmonary Critical Care Medicine [facility] [place]   PULMONARY CRITICAL CARE SERVICE  PROGRESS NOTE  Date of Service: 05/29/2020  Andrea Fuller  HQP:[id number]  DOB: 04-14-1956   DOA: 05/21/2020  Referring Physician: Carron Curie, MD  HPI: Andrea Fuller is a 65 y.o. female seen for follow up of Acute on Chronic Respiratory Failure.  Patient is on pressure support currently on 40% FiO2 has been on a pressure of 12/5  Medications: Reviewed on Rounds  Physical Exam:  Vitals: Temperature is 97.3 pulse 78 respiratory rate 32 blood pressure is 129/60 saturations 98%  Ventilator Settings on pressure support FiO2 40% pressure 12/5  . General: Comfortable at this time . Eyes: Grossly normal lids, irises & conjunctiva . ENT: grossly tongue is normal . Neck: no obvious mass . Cardiovascular: S1 S2 normal no gallop . Respiratory: Coarse rhonchi expansion is equal . Abdomen: soft . Skin: no rash seen on limited exam . Musculoskeletal: not rigid . Psychiatric:unable to assess . Neurologic: no seizure no involuntary movements         Lab Data:   Basic Metabolic Panel: Recent Labs  Lab 05/23/20 0614 05/24/20 0546 05/27/20 0504 05/29/20 0435  NA 139 139 139 140  K 4.3 4.3 4.2 4.0  CL 94* 92* 93* 100  CO2 34* 33* 34* 29  GLUCOSE 121* 138* 142* 119*  BUN 17 21 45* 21  CREATININE 0.48 0.54 0.78 0.47  CALCIUM 9.5 9.7 9.7 8.7*  MG 1.9 1.9 2.1  --   PHOS 4.1 4.7* 4.5 3.5    ABG: Recent Labs  Lab 05/22/20 1315 05/23/20 0601  PHART 7.350 7.401  PCO2ART 67.4* 63.9*  PO2ART 95.1 116*  HCO3 36.5* 38.8*  O2SAT 97.6 98.8    Liver Function Tests: Recent Labs  Lab 05/29/20 0435  ALBUMIN 1.8*   No results for input(s): LIPASE, AMYLASE in the last 168 hours. No results for input(s): AMMONIA in the last 168 hours.  CBC: Recent Labs  Lab 05/23/20 0614 05/24/20 0546 05/27/20 0504 05/28/20 0429 05/29/20 0435  WBC 9.9 12.8* 16.5* 10.8* 6.2  HGB 9.2* 10.2* 8.3*  8.0* 7.8*  HCT 30.2* 32.3* 27.4* 27.9* 27.3*  MCV 93.8 92.3 92.9 95.5 95.1  PLT 303 320 189 159 176    Cardiac Enzymes: No results for input(s): CKTOTAL, CKMB, CKMBINDEX, TROPONINI in the last 168 hours.  BNP (last 3 results) No results for input(s): BNP in the last 8760 hours.  ProBNP (last 3 results) No results for input(s): PROBNP in the last 8760 hours.  Radiological Exams: CT ABDOMEN PELVIS WO CONTRAST  Result Date: 05/27/2020 CLINICAL DATA:  Pneumonia, abscess at PEG site EXAM: CT ABDOMEN AND PELVIS WITHOUT CONTRAST TECHNIQUE: Multidetector CT imaging of the abdomen and pelvis was performed following the standard protocol without IV contrast. Unenhanced CT was performed per clinician order. Lack of IV contrast limits sensitivity and specificity, especially for evaluation of abdominal/pelvic solid viscera. COMPARISON:  05/21/2020 FINDINGS: Lower chest: Multifocal bibasilar airspace disease compatible with pneumonia. Small bilateral pleural effusions, right greater than left. Hepatobiliary: Gallbladder is distended, with high attenuation material compatible with sludge. Liver is unremarkable. Pancreas: Unremarkable. No pancreatic ductal dilatation or surrounding inflammatory changes. Spleen: Normal in size without focal abnormality. Adrenals/Urinary Tract: Adrenal glands are unremarkable. Kidneys are normal, without renal calculi, focal lesion, or hydronephrosis. Bladder is unremarkable. Stomach/Bowel: No bowel obstruction or ileus. Normal appendix right lower quadrant. Diverticulosis of the distal colon without diverticulitis. Respiratory motion in the upper abdomen limits evaluation. A  PEG tube is seen inflated in the subcutaneous tissues. The distal tip of the catheter is not well visualized. 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 There is extensive subcutaneous gas  throughout the anterior abdominal wall, likely due to catheter malpositioning. No fluid collection or abscess. Vascular/Lymphatic: Aortic atherosclerosis. No enlarged abdominal or pelvic lymph nodes. Reproductive: There is a catheter coiled within the vagina, of uncertain etiology. No adnexal masses. Other: No free fluid or free intraperitoneal gas. Musculoskeletal: No acute or destructive bony lesions. Reconstructed images demonstrate no additional findings. IMPRESSION: 1. Malpositioning of the percutaneous gastrostomy tube, with the catheter balloon inflated in the subcutaneous tissues of the anterior abdominal wall. 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 2. Extensive subcutaneous gas within the anterior abdominal wall likely related to the gastrostomy tube malpositioning. No fluid collection or abscess. 3. Catheter coiled within the vagina, of uncertain etiology. 4. Confluent bibasilar pneumonia, with small bilateral pleural effusions. 5. Diverticulosis without diverticulitis. 6. Gallbladder sludge. No evidence of cholelithiasis or cholecystitis. 7.  Aortic Atherosclerosis (ICD10-I70.0). Critical Value/emergent results were called by telephone at the time of interpretation on 05/27/2020 at 3:36 pm to provider Camelia Eng, NP, who verbally acknowledged these results. Electronically Signed   By: Sharlet Salina M.D.   On: 05/27/2020 15:41    Assessment/Plan Active Problems:   Acute on chronic respiratory failure with hypoxia (HCC)   COVID-19 virus infection   Pneumonia due to COVID-19 virus   Acute respiratory distress syndrome (ARDS) due to COVID-19 virus (HCC)   1. Acute on chronic respiratory failure hypoxia we will continue with pressure support titrate oxygen continue pulmonary toilet. 2. COVID-19 virus infection in recovery phase we will continue to follow 3. Pneumonia due to COVID-19 treated 4. ARDS slow improvement we will continue  with supportive care   I have personally seen and evaluated the patient, evaluated laboratory and imaging results, formulated the assessment and plan and placed orders. The Patient requires high complexity decision making with multiple systems involvement.  Rounds were done with the Respiratory Therapy Director and Staff therapists and discussed with nursing staff also.  Yevonne Pax, MD [facility] Pulmonary Critical Care Medicine Sleep Medicine

## 2020-05-29 NOTE — Consult Note (Signed)
Infectious Disease Consultation   Andrea Fuller  LHT:[id number]  DOB: 02-02-1956  DOA: 05/21/2020  Requesting physician: Dr.Hijazi  Reason for consultation: Antibiotic recommendations   History of Present Illness: Andrea Fuller is an 65 y.o. female with medical history significant of morbid obesity, hypertension, hyperlipidemia, osteoarthritis with history of cortisone injection to both knees who was admitted to the acute facility with worsening shortness of breath, respiratory failure.  She was diagnosed to have COVID-19 infection.  She was given treatment with remdesivir, steroids.  She also had acute kidney injury and was given IV fluids.  She subsequently developed ARDS eventually requiring intubation ventilation, tracheostomy and PEG tube placement.  She was also found to have bilateral lower extremity DVTs, started on anticoagulation.  Patient continued to remain encephalopathic.  Per records from the outside facility she had some drainage around the PEG tube site.  She was started on antibiotics however, cultures were all negative therefore antibiotics were discontinued.  Due to her complex medical problems she was transferred and admitted to [facility] on 05/21/2020.  Patient noted to have induration around the PEG tube site.  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  She was also found to have bibasilar pneumonia with pleural effusions.  PEG tube was removed.  However, she had severe induration around the PEG tube site.  She is currently on the vent, and 45% FiO2, 5 of PEEP.   Review of Systems:  Patient intubated, unable to obtain review of systems at this  time.   Past Medical History: Past Medical History:  Diagnosis Date  . Acute on chronic respiratory failure with hypoxia (HCC)   . Acute respiratory distress syndrome (ARDS) due to COVID-19 virus (HCC)   . COVID-19 virus infection   . Pneumonia due to COVID-19 virus   . Primary osteoarthritis of knees, bilateral  . Morbid obesity (HCC)  . Essential hypertension  . Recurrent major depressive disorder, in partial remission (HCC)  . Chronic pain of both knees  . Class 3 severe obesity with body mass index (BMI) of 45.0 to 49.9 in adult [facility])  . Colonoscopy refused  . S/P hysterectomy for benign  . Mixed hyperlipidemia    Past Surgical History: . HYSTERECTOMY  . TUBAL LIGATION  . WRIST FRACTURE SURGERY   Allergies: No known drug allergies.  Social History: Former smoker, quit 1985, occasional alcohol use, no recreational drug abuse  Family History: . Cancer Father  neuroendocrine carcinoma  . Anesthesia problems Neg Hx  . Arthritis Neg Hx  . Asthma Neg Hx  . Cerebral palsy Neg Hx  . Clotting disorder Neg Hx  . Club foot Neg Hx  . Collagen disease Neg Hx  . Deep vein thrombosis Neg Hx  . Diabetes Neg Hx  . Gait disorder Neg Hx  . Gout Neg Hx  . Heart disease Neg Hx  . Hip dysplasia Neg Hx  . Hip fracture Neg Hx  . Hypermobility Neg Hx  . Hypertension Neg Hx  . Osteoporosis Neg Hx  . Other Neg Hx  . Pulmonary embolism Neg Hx  . Rheumatologic disease Neg Hx  . Scoliosis Neg Hx  . Spina bifida  Neg Hx  . Stroke Neg Hx  . Thyroid disease Neg Hx  . Vasculitis Neg Hx     Physical Exam: Vitals: Temperature 98.5, pulse 77, respiratory rate 25, blood pressure 123/53, pulse oximetry 96% Constitutional: Ill-appearing female, on vent. Head: Atraumatic, normocephalic Eyes: PERLA ENMT: external ears and nose appear normal,Lips appears normal, moist oral mucosa  Neck: Has trach in place CVS: S1-S2 Respiratory: Coarse breath sounds, rhonchi, no wheezing Abdomen:  Obese, prior PEG tube site with drainage, surrounding induration, positive bowel sounds Musculoskeletal: Edema Neuro: Lethargic but opening eyes, however not following commands.  Unable to do neurologic exam at this time Psych: Unable to assess at this time Skin: Erythema, induration around the previous PEG tube site  Data reviewed:  I have personally reviewed following labs and imaging studies Labs:  CBC: Recent Labs  Lab 05/23/20 0614 05/24/20 0546 05/27/20 0504 05/28/20 0429 05/29/20 0435  WBC 9.9 12.8* 16.5* 10.8* 6.2  HGB 9.2* 10.2* 8.3* 8.0* 7.8*  HCT 30.2* 32.3* 27.4* 27.9* 27.3*  MCV 93.8 92.3 92.9 95.5 95.1  PLT 303 320 189 159 176    Basic Metabolic Panel: Recent Labs  Lab 05/23/20 0614 05/24/20 0546 05/27/20 0504 05/29/20 0435  NA 139 139 139 140  K 4.3 4.3 4.2 4.0  CL 94* 92* 93* 100  CO2 34* 33* 34* 29  GLUCOSE 121* 138* 142* 119*  BUN 17 21 45* 21  CREATININE 0.48 0.54 0.78 0.47  CALCIUM 9.5 9.7 9.7 8.7*  MG 1.9 1.9 2.1  --   PHOS 4.1 4.7* 4.5 3.5   GFR CrCl cannot be calculated (Unknown ideal weight.). Liver Function Tests: Recent Labs  Lab 05/29/20 0435  ALBUMIN 1.8*   No results for input(s): LIPASE, AMYLASE in the last 168 hours. No results for input(s): AMMONIA in the last 168 hours. Coagulation profile No results for input(s): INR, PROTIME in the last 168 hours.  Cardiac Enzymes: No results for input(s): CKTOTAL, CKMB, CKMBINDEX, TROPONINI in the last 168 hours. BNP: Invalid input(s): POCBNP CBG: No results for input(s): GLUCAP in the last 168 hours. D-Dimer No results for input(s): DDIMER in the last 72 hours. Hgb A1c No results for input(s): HGBA1C in the last 72 hours. Lipid Profile No results for input(s): CHOL, HDL, LDLCALC, TRIG, CHOLHDL, LDLDIRECT in the last 72 hours. Thyroid function studies No results for input(s): TSH, T4TOTAL, T3FREE, THYROIDAB in the last 72 hours.  Invalid input(s): FREET3 Anemia work up No  results for input(s): VITAMINB12, FOLATE, FERRITIN, TIBC, IRON, RETICCTPCT in the last 72 hours. Urinalysis    Component Value Date/Time   COLORURINE AMBER (A) 05/26/2020 1700   APPEARANCEUR HAZY (A) 05/26/2020 1700   LABSPEC 1.021 05/26/2020 1700   PHURINE 5.0 05/26/2020 1700   GLUCOSEU NEGATIVE 05/26/2020 1700   HGBUR NEGATIVE 05/26/2020 1700   BILIRUBINUR NEGATIVE 05/26/2020 1700   KETONESUR NEGATIVE 05/26/2020 1700   PROTEINUR 30 (A) 05/26/2020 1700   NITRITE POSITIVE (A) 05/26/2020 1700   LEUKOCYTESUR TRACE (A) 05/26/2020 1700     Sepsis Labs Invalid input(s): PROCALCITONIN,  WBC,  LACTICIDVEN Microbiology Recent Results (from the past 240 hour(s))  Culture, respiratory (non-expectorated)     Status: None   Collection Time: 05/21/20  1:38 PM   Specimen: Tracheal Aspirate; Respiratory  Result Value Ref Range Status   Specimen Description TRACHEAL ASPIRATE  Final   Special Requests NONE  Final   Gram Stain   Final    FEW WBC PRESENT, PREDOMINANTLY PMN NO ORGANISMS SEEN  Culture   Final    RARE Normal respiratory flora-no Staph aureus or Pseudomonas seen Performed at [facility], [address] [place], [place] [address]    Report Status 05/24/2020 FINAL  Final  Culture, Urine     Status: Abnormal   Collection Time: 05/21/20  4:45 PM   Specimen: Urine, Random  Result Value Ref Range Status   Specimen Description URINE, RANDOM  Final   Special Requests NONE  Final   Culture (A)  Final    <10,000 COLONIES/mL INSIGNIFICANT GROWTH Performed at [facility], [address] [place], [place] [address]    Report Status 05/23/2020 FINAL  Final  Culture, respiratory (non-expectorated)     Status: None   Collection Time: 05/26/20 11:17 AM   Specimen: Tracheal Aspirate; Respiratory  Result Value Ref Range Status   Specimen Description TRACHEAL ASPIRATE  Final   Special Requests NONE  Final   Gram Stain   Final    ABUNDANT WBC PRESENT,BOTH PMN AND  MONONUCLEAR NO ORGANISMS SEEN Performed at [facility], [address], [place], [place] [address]    Culture FEW STAPHYLOCOCCUS EPIDERMIDIS  Final   Report Status 05/28/2020 FINAL  Final   Organism ID, Bacteria STAPHYLOCOCCUS EPIDERMIDIS  Final      Susceptibility   Staphylococcus epidermidis - MIC*    CIPROFLOXACIN >=8 RESISTANT Resistant     ERYTHROMYCIN >=8 RESISTANT Resistant     GENTAMICIN >=16 RESISTANT Resistant     OXACILLIN >=4 RESISTANT Resistant     TETRACYCLINE 2 SENSITIVE Sensitive     VANCOMYCIN 1 SENSITIVE Sensitive     TRIMETH/SULFA 80 RESISTANT Resistant     CLINDAMYCIN >=8 RESISTANT Resistant     RIFAMPIN <=0.5 SENSITIVE Sensitive     Inducible Clindamycin NEGATIVE Sensitive     * FEW STAPHYLOCOCCUS EPIDERMIDIS  Culture, Urine     Status: Abnormal   Collection Time: 05/26/20 11:23 AM   Specimen: Urine, Random  Result Value Ref Range Status   Specimen Description URINE, RANDOM  Final   Special Requests   Final    NONE Performed at [facility], [address] [place], [place] [address]    Culture >=100,000 COLONIES/mL ESCHERICHIA COLI (A)  Final   Report Status 05/29/2020 FINAL  Final   Organism ID, Bacteria ESCHERICHIA COLI (A)  Final      Susceptibility   Escherichia coli - MIC*    AMPICILLIN <=2 SENSITIVE Sensitive     CEFAZOLIN <=4 SENSITIVE Sensitive     CEFEPIME <=0.12 SENSITIVE Sensitive     CEFTRIAXONE <=0.25 SENSITIVE Sensitive     CIPROFLOXACIN <=0.25 SENSITIVE Sensitive     GENTAMICIN <=1 SENSITIVE Sensitive     IMIPENEM <=0.25 SENSITIVE Sensitive     NITROFURANTOIN <=16 SENSITIVE Sensitive     TRIMETH/SULFA <=20 SENSITIVE Sensitive     AMPICILLIN/SULBACTAM <=2 SENSITIVE Sensitive     PIP/TAZO <=4 SENSITIVE Sensitive     * >=100,000 COLONIES/mL ESCHERICHIA COLI     Inpatient Medications:   Please see MAR   Radiological Exams on Admission: No results found.  Impression/Recommendations Active Problems:   Acute on  chronic respiratory failure with hypoxia, vent dependent Abdominal wall cellulitis   COVID-19 virus infection   Pneumonia due to COVID-19 virus UTI with E. Coli   Acute respiratory distress syndrome (ARDS) due to COVID-19 virus (HCC) Systemic inflammatory response syndrome Bilateral lower extremity DVT History of depression Morbid obesity Anemia Dysphagia  Acute on chronic  respiratory failure with hypoxemia: Patient initially had COVID-19 infection resulting in respiratory failure.  Currently has trach in place, vent dependent.  Unfortunately she also developed ARDS.  She also had probable secondary bacterial pneumonia for which she received antibiotics at the outside facility.  However, cultures were negative therefore antibiotics were discontinued.  Here she had fever, systemic inflammatory response syndrome.  Respiratory cultures showing staph epidermidis.  Started on empiric IV vancomycin, cefepime, Flagyl.  At this time would recommend to continue treatment with IV vancomycin for the pneumonia.  She also has cellulitis and induration in the PEG tube site. Would recommend to switch to Ancef to cover for the cellulitis.  No evidence of gram-negative organism on the respiratory cultures.  Pulmonary following.  Monitor BUN/creatinine closely while on antibiotics.  Abdominal wall cellulitis: Patient had severe cellulitis with induration likely secondary to displacement of the PEG tube.  CT findings as mentioned above.  Currently PEG tube has been removed.  Suggest to continue treatment with IV vancomycin, Ancef.  We will plan to treat for duration of 2 weeks.  The area has been marked.  Continue to monitor closely.  COVID-19 infection: Patient was treated with remdesivir, steroids at the acute facility.  Continue supportive management per primary team.  Pneumonia: Respiratory cultures showing staph.  On treatment with IV vancomycin.  She unfortunately also likely has ARDS from the COVID-19  infection.  She has dysphagia and high risk for aspiration and recurrent aspiration pneumonia.  We will plan to treat for duration of 2 weeks because she also has severe cellulitis with induration at the PEG tube site.  Please monitor BUN/trending closely while on antibiotics.  UTI: Urine culture showing E. coli.  Susceptible to Ancef.  Antibiotics as mentioned above.  History of depression: Patient apparently was on multiple medications for her depression.  Daughter at the bedside thinks patient may be withdrawing from some of those medications.  Monitor closely.  Further management per primary team.  Bilateral lower extremity DVT: On anticoagulation per the primary team.  Dysphagia: Unfortunately due to her dysphagia she is high risk for aspiration and aspiration pneumonia.  Due to her complex medical problems she is very high risk for worsening and decompensation.  Plan of care discussed at length with the daughter at the bedside.  Also discussed with pharmacy and primary team. Thank you for involving [place] in the care of this patient.  Vonzella Nipple M.D. 05/29/2020, 6:12 PM

## 2020-05-30 DIAGNOSIS — U071 COVID-19: Secondary | ICD-10-CM | POA: Diagnosis not present

## 2020-05-30 DIAGNOSIS — Z9911 Dependence on respirator [ventilator] status: Secondary | ICD-10-CM | POA: Diagnosis not present

## 2020-05-30 DIAGNOSIS — J80 Acute respiratory distress syndrome: Secondary | ICD-10-CM | POA: Diagnosis not present

## 2020-05-30 DIAGNOSIS — J9621 Acute and chronic respiratory failure with hypoxia: Secondary | ICD-10-CM | POA: Diagnosis not present

## 2020-05-30 LAB — CBC
HCT: 28.7 % — ABNORMAL LOW (ref 36.0–46.0)
Hemoglobin: 8.4 g/dL — ABNORMAL LOW (ref 12.0–15.0)
MCH: 27.4 pg (ref 26.0–34.0)
MCHC: 29.3 g/dL — ABNORMAL LOW (ref 30.0–36.0)
MCV: 93.5 fL (ref 80.0–100.0)
Platelets: 228 10*3/uL (ref 150–400)
RBC: 3.07 MIL/uL — ABNORMAL LOW (ref 3.87–5.11)
RDW: 16.7 % — ABNORMAL HIGH (ref 11.5–15.5)
WBC: 5.7 10*3/uL (ref 4.0–10.5)
nRBC: 0 % (ref 0.0–0.2)

## 2020-05-30 LAB — MAGNESIUM: Magnesium: 1.8 mg/dL (ref 1.7–2.4)

## 2020-05-30 LAB — PHOSPHORUS: Phosphorus: 3.1 mg/dL (ref 2.5–4.6)

## 2020-05-30 LAB — OCCULT BLOOD X 1 CARD TO LAB, STOOL: Fecal Occult Bld: NEGATIVE

## 2020-05-30 NOTE — Progress Notes (Signed)
Pulmonary Critical Care Medicine [facility] [place]   PULMONARY CRITICAL CARE SERVICE  PROGRESS NOTE  Date of Service: 05/30/2020  Andrea Fuller  OVF:[id number]  DOB: 08-30-1955   DOA: 05/21/2020  Referring Physician: Carron Curie, MD  HPI: Andrea Fuller is a 65 y.o. female seen for follow up of Acute on Chronic Respiratory Failure.  Patient currently is on full support supposed to do pressure support today  Medications: Reviewed on Rounds  Physical Exam:  Vitals: Temperature is 97.8 pulse 83 respiratory rate is 30 blood pressure is 147/65 saturations 95%  Ventilator Settings on pressure support 12/5  . General: Comfortable at this time . Eyes: Grossly normal lids, irises & conjunctiva . ENT: grossly tongue is normal . Neck: no obvious mass . Cardiovascular: S1 S2 normal no gallop . Respiratory: No rhonchi coarse breath sound . Abdomen: soft . Skin: no rash seen on limited exam . Musculoskeletal: not rigid . Psychiatric:unable to assess . Neurologic: no seizure no involuntary movements         Lab Data:   Basic Metabolic Panel: Recent Labs  Lab 05/24/20 0546 05/27/20 0504 05/29/20 0435 05/30/20 0636  NA 139 139 140  --   K 4.3 4.2 4.0  --   CL 92* 93* 100  --   CO2 33* 34* 29  --   GLUCOSE 138* 142* 119*  --   BUN 21 45* 21  --   CREATININE 0.54 0.78 0.47  --   CALCIUM 9.7 9.7 8.7*  --   MG 1.9 2.1  --  1.8  PHOS 4.7* 4.5 3.5 3.1    ABG: No results for input(s): PHART, PCO2ART, PO2ART, HCO3, O2SAT in the last 168 hours.  Liver Function Tests: Recent Labs  Lab 05/29/20 0435  ALBUMIN 1.8*   No results for input(s): LIPASE, AMYLASE in the last 168 hours. No results for input(s): AMMONIA in the last 168 hours.  CBC: Recent Labs  Lab 05/24/20 0546 05/27/20 0504 05/28/20 0429 05/29/20 0435 05/30/20 0636  WBC 12.8* 16.5* 10.8* 6.2 5.7  HGB 10.2* 8.3* 8.0* 7.8* 8.4*  HCT 32.3* 27.4* 27.9* 27.3* 28.7*  MCV 92.3 92.9 95.5 95.1 93.5  PLT  320 189 159 176 228    Cardiac Enzymes: No results for input(s): CKTOTAL, CKMB, CKMBINDEX, TROPONINI in the last 168 hours.  BNP (last 3 results) No results for input(s): BNP in the last 8760 hours.  ProBNP (last 3 results) No results for input(s): PROBNP in the last 8760 hours.  Radiological Exams: DG Fluoro Rm 1-60 Min  Result Date: 05/29/2020 CLINICAL DATA:  Malnutrition EXAM: ATTEMPTED DOBBHOFF PLACEMENT CONTRAST:  None FLUOROSCOPY TIME:  Fluoroscopy Time:  1 minutes 54 seconds Radiation Exposure Index (if provided by the fluoroscopic device): 10.6 mGy Number of Acquired Spot Images: 1 COMPARISON:  None. FINDINGS: Attempts were made to pass a Dobbhoff catheter under fluoroscopic guidance. These were unsuccessful as the catheter would not pass the level of the tracheostomy. The study was then terminated. IMPRESSION: Unsuccessful attempts to place Dobbhoff catheter as described. Electronically Signed   By: Alcide Clever M.D.   On: 05/29/2020 11:58    Assessment/Plan Active Problems:   Acute on chronic respiratory failure with hypoxia (HCC)   COVID-19 virus infection   Pneumonia due to COVID-19 virus   Acute respiratory distress syndrome (ARDS) due to COVID-19 virus (HCC)   1. Acute on chronic respiratory failure hypoxia we will go ahead and downsize trach to a #6 right now she has a #  8 trach in place 2. COVID-19 virus infection recovery 3. Pneumonia due to COVID-19 treated we will continue to monitor 4. ARDS treated slow improvement   I have personally seen and evaluated the patient, evaluated laboratory and imaging results, formulated the assessment and plan and placed orders. The Patient requires high complexity decision making with multiple systems involvement.  Rounds were done with the Respiratory Therapy Director and Staff therapists and discussed with nursing staff also.  Yevonne Pax, MD [facility] Pulmonary Critical Care Medicine Sleep Medicine

## 2020-05-31 ENCOUNTER — Other Ambulatory Visit (HOSPITAL_COMMUNITY): Payer: Medicare HMO

## 2020-05-31 DIAGNOSIS — U071 COVID-19: Secondary | ICD-10-CM | POA: Diagnosis not present

## 2020-05-31 DIAGNOSIS — J80 Acute respiratory distress syndrome: Secondary | ICD-10-CM | POA: Diagnosis not present

## 2020-05-31 DIAGNOSIS — Z9911 Dependence on respirator [ventilator] status: Secondary | ICD-10-CM | POA: Diagnosis not present

## 2020-05-31 DIAGNOSIS — J9621 Acute and chronic respiratory failure with hypoxia: Secondary | ICD-10-CM | POA: Diagnosis not present

## 2020-05-31 NOTE — Progress Notes (Signed)
Pulmonary Critical Care Medicine [facility] [place]   PULMONARY CRITICAL CARE SERVICE  PROGRESS NOTE  Date of Service: 05/31/2020  Andrea Fuller  OZD:[id number]  DOB: November 04, 1955   DOA: 05/21/2020  Referring Physician: Carron Curie, MD  HPI: Andrea Fuller is a 65 y.o. female seen for follow up of Acute on Chronic Respiratory Failure.  Patient is currently on the ventilator full support was attempted on T collar but failed  Medications: Reviewed on Rounds  Physical Exam:  Vitals: Temperature 98.2 pulse 71 respiratory 30 blood pressure is 157/62 saturation is 94%  Ventilator Settings on pressure support 12/5  . General: Comfortable at this time . Eyes: Grossly normal lids, irises & conjunctiva . ENT: grossly tongue is normal . Neck: no obvious mass . Cardiovascular: S1 S2 normal no gallop . Respiratory: Scattered rhonchi coarse breath sound . Abdomen: soft . Skin: no rash seen on limited exam . Musculoskeletal: not rigid . Psychiatric:unable to assess . Neurologic: no seizure no involuntary movements         Lab Data:   Basic Metabolic Panel: Recent Labs  Lab 05/27/20 0504 05/29/20 0435 05/30/20 0636  NA 139 140  --   K 4.2 4.0  --   CL 93* 100  --   CO2 34* 29  --   GLUCOSE 142* 119*  --   BUN 45* 21  --   CREATININE 0.78 0.47  --   CALCIUM 9.7 8.7*  --   MG 2.1  --  1.8  PHOS 4.5 3.5 3.1    ABG: No results for input(s): PHART, PCO2ART, PO2ART, HCO3, O2SAT in the last 168 hours.  Liver Function Tests: Recent Labs  Lab 05/29/20 0435  ALBUMIN 1.8*   No results for input(s): LIPASE, AMYLASE in the last 168 hours. No results for input(s): AMMONIA in the last 168 hours.  CBC: Recent Labs  Lab 05/27/20 0504 05/28/20 0429 05/29/20 0435 05/30/20 0636  WBC 16.5* 10.8* 6.2 5.7  HGB 8.3* 8.0* 7.8* 8.4*  HCT 27.4* 27.9* 27.3* 28.7*  MCV 92.9 95.5 95.1 93.5  PLT 189 159 176 228    Cardiac Enzymes: No results for input(s): CKTOTAL, CKMB,  CKMBINDEX, TROPONINI in the last 168 hours.  BNP (last 3 results) No results for input(s): BNP in the last 8760 hours.  ProBNP (last 3 results) No results for input(s): PROBNP in the last 8760 hours.  Radiological Exams: DG CHEST PORT 1 VIEW  Result Date: 05/31/2020 CLINICAL DATA:  Tachycardia EXAM: PORTABLE CHEST 1 VIEW COMPARISON:  05/24/2020 FINDINGS: Tracheostomy tube in good position. Normal cardiac silhouette. Diffuse bilateral airspace disease. No focal consolidation. No pleural fluid or pneumothorax. IMPRESSION: No change in diffuse airspace disease. Electronically Signed   By: Genevive Bi M.D.   On: 05/31/2020 09:43    Assessment/Plan Active Problems:   Acute on chronic respiratory failure with hypoxia (HCC)   COVID-19 virus infection   Pneumonia due to COVID-19 virus   Acute respiratory distress syndrome (ARDS) due to COVID-19 virus (HCC)   1. Acute on chronic respiratory failure hypoxia we will continue with full support right now patient failed attempt at T collar we will continue with pulmonary toilet 2. COVID-19 virus infection in resolution 3. Pneumonia due to COVID-19 treated continue to follow 4. ARDS treated improving slightly   I have personally seen and evaluated the patient, evaluated laboratory and imaging results, formulated the assessment and plan and placed orders. The Patient requires high complexity decision making with multiple systems involvement.  Rounds were done with the Respiratory Therapy Director and Staff therapists and discussed with nursing staff also.  Allyne Gee, MD [facility] Pulmonary Critical Care Medicine Sleep Medicine

## 2020-06-01 ENCOUNTER — Other Ambulatory Visit (HOSPITAL_COMMUNITY): Payer: Medicare HMO

## 2020-06-01 DIAGNOSIS — Z9911 Dependence on respirator [ventilator] status: Secondary | ICD-10-CM | POA: Diagnosis not present

## 2020-06-01 DIAGNOSIS — J80 Acute respiratory distress syndrome: Secondary | ICD-10-CM | POA: Diagnosis not present

## 2020-06-01 DIAGNOSIS — J9621 Acute and chronic respiratory failure with hypoxia: Secondary | ICD-10-CM | POA: Diagnosis not present

## 2020-06-01 DIAGNOSIS — U071 COVID-19: Secondary | ICD-10-CM | POA: Diagnosis not present

## 2020-06-01 LAB — CBC
HCT: 31.7 % — ABNORMAL LOW (ref 36.0–46.0)
Hemoglobin: 9.2 g/dL — ABNORMAL LOW (ref 12.0–15.0)
MCH: 26.9 pg (ref 26.0–34.0)
MCHC: 29 g/dL — ABNORMAL LOW (ref 30.0–36.0)
MCV: 92.7 fL (ref 80.0–100.0)
Platelets: 338 10*3/uL (ref 150–400)
RBC: 3.42 MIL/uL — ABNORMAL LOW (ref 3.87–5.11)
RDW: 16.8 % — ABNORMAL HIGH (ref 11.5–15.5)
WBC: 9.6 10*3/uL (ref 4.0–10.5)
nRBC: 0 % (ref 0.0–0.2)

## 2020-06-01 LAB — RENAL FUNCTION PANEL
Albumin: 2.1 g/dL — ABNORMAL LOW (ref 3.5–5.0)
Anion gap: 8 (ref 5–15)
BUN: 14 mg/dL (ref 8–23)
CO2: 33 mmol/L — ABNORMAL HIGH (ref 22–32)
Calcium: 8.7 mg/dL — ABNORMAL LOW (ref 8.9–10.3)
Chloride: 100 mmol/L (ref 98–111)
Creatinine, Ser: 0.42 mg/dL — ABNORMAL LOW (ref 0.44–1.00)
GFR, Estimated: >60 mL/min (ref >60)
Glucose, Bld: 141 mg/dL — ABNORMAL HIGH (ref 70–99)
Phosphorus: 3.7 mg/dL (ref 2.5–4.6)
Potassium: 3.6 mmol/L (ref 3.5–5.1)
Sodium: 141 mmol/L (ref 135–145)

## 2020-06-01 LAB — MAGNESIUM: Magnesium: 1.7 mg/dL (ref 1.7–2.4)

## 2020-06-01 NOTE — Progress Notes (Signed)
Pulmonary Critical Care Medicine [facility] [place]   PULMONARY CRITICAL CARE SERVICE  PROGRESS NOTE  Date of Service: 06/01/2020  Andrea Fuller  [id number]  DOB: 08-03-1955   DOA: 05/21/2020  Referring Physician: Carron Curie, MD  HPI: Andrea Fuller is a 65 y.o. female seen for follow up of Acute on Chronic Respiratory Failure.  Patient currently is on pressure support has been on 35% FiO2 with the pressure of 12/5  Medications: Reviewed on Rounds  Physical Exam:  Vitals: Temperature 97.0 pulse 70 respiratory rate 26 blood pressure is 157/78 saturations 98%  Ventilator Settings currently on pressure support FiO2 35% pressure 12/5  . General: Comfortable at this time . Eyes: Grossly normal lids, irises & conjunctiva . ENT: grossly tongue is normal . Neck: no obvious mass . Cardiovascular: S1 S2 normal no gallop . Respiratory: Scattered rhonchi very coarse breath sounds are noted at this time . Abdomen: soft . Skin: no rash seen on limited exam . Musculoskeletal: not rigid . Psychiatric:unable to assess . Neurologic: no seizure no involuntary movements         Lab Data:   Basic Metabolic Panel: Recent Labs  Lab 05/27/20 0504 05/29/20 0435 05/30/20 0636 06/01/20 0445  NA 139 140  --  141  K 4.2 4.0  --  3.6  CL 93* 100  --  100  CO2 34* 29  --  33*  GLUCOSE 142* 119*  --  141*  BUN 45* 21  --  14  CREATININE 0.78 0.47  --  0.42*  CALCIUM 9.7 8.7*  --  8.7*  MG 2.1  --  1.8 1.7  PHOS 4.5 3.5 3.1 3.7    ABG: No results for input(s): PHART, PCO2ART, PO2ART, HCO3, O2SAT in the last 168 hours.  Liver Function Tests: Recent Labs  Lab 05/29/20 0435 06/01/20 0445  ALBUMIN 1.8* 2.1*   No results for input(s): LIPASE, AMYLASE in the last 168 hours. No results for input(s): AMMONIA in the last 168 hours.  CBC: Recent Labs  Lab 05/27/20 0504 05/28/20 0429 05/29/20 0435 05/30/20 0636 06/01/20 0445  WBC 16.5* 10.8* 6.2 5.7 9.6  HGB 8.3* 8.0*  7.8* 8.4* 9.2*  HCT 27.4* 27.9* 27.3* 28.7* 31.7*  MCV 92.9 95.5 95.1 93.5 92.7  PLT 189 159 176 228 338    Cardiac Enzymes: No results for input(s): CKTOTAL, CKMB, CKMBINDEX, TROPONINI in the last 168 hours.  BNP (last 3 results) No results for input(s): BNP in the last 8760 hours.  ProBNP (last 3 results) No results for input(s): PROBNP in the last 8760 hours.  Radiological Exams: DG CHEST PORT 1 VIEW  Result Date: 05/31/2020 CLINICAL DATA:  Tachycardia EXAM: PORTABLE CHEST 1 VIEW COMPARISON:  05/24/2020 FINDINGS: Tracheostomy tube in good position. Normal cardiac silhouette. Diffuse bilateral airspace disease. No focal consolidation. No pleural fluid or pneumothorax. IMPRESSION: No change in diffuse airspace disease. Electronically Signed   By: Genevive Bi M.D.   On: 05/31/2020 09:43    Assessment/Plan Active Problems:   Acute on chronic respiratory failure with hypoxia (HCC)   COVID-19 virus infection   Pneumonia due to COVID-19 virus   Acute respiratory distress syndrome (ARDS) due to COVID-19 virus (HCC)   1. Acute on chronic respiratory failure with hypoxia we will continue with the on pressure support wean as tolerated we will continue pulmonary toilet supportive care. 2. COVID-19 virus infection recovery phase we will continue to follow along. 3. Pneumonia due to COVID-19 treated we will continue to  follow along. 4. ARDS treated slow improvement we will continue to follow along.   I have personally seen and evaluated the patient, evaluated laboratory and imaging results, formulated the assessment and plan and placed orders. The Patient requires high complexity decision making with multiple systems involvement.  Rounds were done with the Respiratory Therapy Director and Staff therapists and discussed with nursing staff also.  Yevonne Pax, MD [facility] Pulmonary Critical Care Medicine Sleep Medicine

## 2020-06-01 NOTE — Progress Notes (Signed)
  Echocardiogram 2D Echocardiogram has been performed.  Andrea Fuller 06/01/2020, 9:05 AM

## 2020-06-02 ENCOUNTER — Other Ambulatory Visit (HOSPITAL_COMMUNITY): Payer: Medicare HMO

## 2020-06-02 DIAGNOSIS — Z9911 Dependence on respirator [ventilator] status: Secondary | ICD-10-CM | POA: Diagnosis not present

## 2020-06-02 DIAGNOSIS — J80 Acute respiratory distress syndrome: Secondary | ICD-10-CM | POA: Diagnosis not present

## 2020-06-02 DIAGNOSIS — U071 COVID-19: Secondary | ICD-10-CM | POA: Diagnosis not present

## 2020-06-02 DIAGNOSIS — J9621 Acute and chronic respiratory failure with hypoxia: Secondary | ICD-10-CM | POA: Diagnosis not present

## 2020-06-02 LAB — BLOOD GAS, ARTERIAL
Acid-Base Excess: 7.6 mmol/L — ABNORMAL HIGH (ref 0.0–2.0)
Bicarbonate: 32.9 mmol/L — ABNORMAL HIGH (ref 20.0–28.0)
FIO2: 100
O2 Saturation: 99.7 %
Patient temperature: 36.6
pCO2 arterial: 58.3 mmHg — ABNORMAL HIGH (ref 32.0–48.0)
pH, Arterial: 7.368 (ref 7.350–7.450)
pO2, Arterial: 253 mmHg — ABNORMAL HIGH (ref 83.0–108.0)

## 2020-06-02 LAB — PHOSPHORUS: Phosphorus: 3.6 mg/dL (ref 2.5–4.6)

## 2020-06-02 LAB — MAGNESIUM: Magnesium: 2 mg/dL (ref 1.7–2.4)

## 2020-06-02 LAB — ECHOCARDIOGRAM COMPLETE
Area-P 1/2: 4.21 cm2
Calc EF: 56.1 %
S' Lateral: 3.9 cm
Single Plane A2C EF: 62.4 %
Single Plane A4C EF: 47.6 %

## 2020-06-02 NOTE — Progress Notes (Signed)
Pulmonary Critical Care Medicine [facility] [place]   PULMONARY CRITICAL CARE SERVICE  PROGRESS NOTE  Date of Service: 06/02/2020  Andrea Fuller  WPY:[id number]  DOB: [birth year]   DOA: 05/21/2020  Referring Physician: Carron Curie, MD  HPI: Andrea Fuller is a 65 y.o. female seen for follow up of Acute on Chronic Respiratory Failure.  Patient currently is on pressure control mode has been on 40% FiO2 she is at her baseline  Medications: Reviewed on Rounds  Physical Exam:  Vitals: Temperature 97.3 pulse 65 respiratory 21 blood pressure is 146/82 saturations 99  Ventilator Settings on pressure assist control FiO2 40% IP 14 PEEP 5  . General: Comfortable at this time . Eyes: Grossly normal lids, irises & conjunctiva . ENT: grossly tongue is normal . Neck: no obvious mass . Cardiovascular: S1 S2 normal no gallop . Respiratory: Scattered rhonchi coarse breath sounds . Abdomen: soft . Skin: no rash seen on limited exam . Musculoskeletal: not rigid . Psychiatric:unable to assess . Neurologic: no seizure no involuntary movements         Lab Data:   Basic Metabolic Panel: Recent Labs  Lab 05/27/20 0504 05/29/20 0435 05/30/20 0636 06/01/20 0445 06/02/20 0444  NA 139 140  --  141  --   K 4.2 4.0  --  3.6  --   CL 93* 100  --  100  --   CO2 34* 29  --  33*  --   GLUCOSE 142* 119*  --  141*  --   BUN 45* 21  --  14  --   CREATININE 0.78 0.47  --  0.42*  --   CALCIUM 9.7 8.7*  --  8.7*  --   MG 2.1  --  1.8 1.7 2.0  PHOS 4.5 3.5 3.1 3.7 3.6    ABG: No results for input(s): PHART, PCO2ART, PO2ART, HCO3, O2SAT in the last 168 hours.  Liver Function Tests: Recent Labs  Lab 05/29/20 0435 06/01/20 0445  ALBUMIN 1.8* 2.1*   No results for input(s): LIPASE, AMYLASE in the last 168 hours. No results for input(s): AMMONIA in the last 168 hours.  CBC: Recent Labs  Lab 05/27/20 0504 05/28/20 0429 05/29/20 0435 05/30/20 0636 06/01/20 0445  WBC 16.5* 10.8*  6.2 5.7 9.6  HGB 8.3* 8.0* 7.8* 8.4* 9.2*  HCT 27.4* 27.9* 27.3* 28.7* 31.7*  MCV 92.9 95.5 95.1 93.5 92.7  PLT 189 159 176 228 338    Cardiac Enzymes: No results for input(s): CKTOTAL, CKMB, CKMBINDEX, TROPONINI in the last 168 hours.  BNP (last 3 results) No results for input(s): BNP in the last 8760 hours.  ProBNP (last 3 results) No results for input(s): PROBNP in the last 8760 hours.  Radiological Exams: ECHOCARDIOGRAM COMPLETE  Result Date: 06/02/2020    ECHOCARDIOGRAM REPORT   Patient Name:   Andrea Fuller Date of Exam: 06/01/2020 Medical Rec #:  [id number]    Height: Accession #:    [id number]   Weight: Date of Birth:  Nov 28, 1955    BSA: Patient Age:    64 years     BP:           157/62 mmHg Patient Gender: F            HR:           75 bpm. Exam Location:  Inpatient Procedure: 2D Echo, Cardiac Doppler and Color Doppler Indications:     Cardiomegaly I51.7  CHF-Acute Systolic I50.21  History:         Patient has no prior history of Echocardiogram examinations.                  Risk Factors:Hypertension and Dyslipidemia.  Sonographer:     Renella Cunas RDCS Referring Phys:  [id number] PRIYA VARGHESE Diagnosing Phys: Orpah Cobb MD  Sonographer Comments: Echo performed with patient supine and on artificial respirator. IMPRESSIONS  1. Left ventricular ejection fraction, by estimation, is 55 to 60%. The left ventricle has normal function. The left ventricle has no regional wall motion abnormalities. Left ventricular diastolic parameters are consistent with Grade I diastolic dysfunction (impaired relaxation).  2. Right ventricular systolic function is normal. The right ventricular size is normal. There is normal pulmonary artery systolic pressure.  3. Left atrial size was mildly dilated.  4. Right atrial size was mildly dilated.  5. The mitral valve is degenerative. Mild mitral valve regurgitation.  6. The aortic valve is tricuspid. There is mild calcification of the aortic valve. There is  mild thickening of the aortic valve. Aortic valve regurgitation is not visualized. Mild to moderate aortic valve sclerosis/calcification is present, without any evidence of aortic stenosis.  7. There is mild (Grade II) atheroma plaque involving the aortic root and ascending aorta.  8. The inferior vena cava is dilated in size with <50% respiratory variability, suggesting right atrial pressure of 15 mmHg. FINDINGS  Left Ventricle: Left ventricular ejection fraction, by estimation, is 55 to 60%. The left ventricle has normal function. The left ventricle has no regional wall motion abnormalities. The left ventricular internal cavity size was normal in size. There is  no left ventricular hypertrophy. Left ventricular diastolic parameters are consistent with Grade I diastolic dysfunction (impaired relaxation). Right Ventricle: The right ventricular size is normal. No increase in right ventricular wall thickness. Right ventricular systolic function is normal. There is normal pulmonary artery systolic pressure. The tricuspid regurgitant velocity is 2.45 m/s, and  with an assumed right atrial pressure of 3 mmHg, the estimated right ventricular systolic pressure is 27.0 mmHg. Left Atrium: Left atrial size was mildly dilated. Right Atrium: Right atrial size was mildly dilated. Pericardium: There is no evidence of pericardial effusion. Mitral Valve: The mitral valve is degenerative in appearance. Mild mitral valve regurgitation. Tricuspid Valve: The tricuspid valve is normal in structure. Tricuspid valve regurgitation is mild. Aortic Valve: The aortic valve is tricuspid. There is mild calcification of the aortic valve. There is mild thickening of the aortic valve. There is mild aortic valve annular calcification. Aortic valve regurgitation is not visualized. Mild to moderate aortic valve sclerosis/calcification is present, without any evidence of aortic stenosis. Pulmonic Valve: The pulmonic valve was normal in structure.  Pulmonic valve regurgitation is not visualized. Aorta: The aortic root is normal in size and structure. There is mild (Grade II) atheroma plaque involving the aortic root and ascending aorta. Venous: The inferior vena cava is dilated in size with less than 50% respiratory variability, suggesting right atrial pressure of 15 mmHg. IAS/Shunts: The interatrial septum was not well visualized.  LEFT VENTRICLE PLAX 2D LVIDd:         5.30 cm      Diastology LVIDs:         3.90 cm      LV e' medial:    7.73 cm/s LV PW:         0.90 cm      LV E/e' medial:  13.1 LV IVS:  0.90 cm      LV e' lateral:   8.63 cm/s LVOT diam:     2.40 cm      LV E/e' lateral: 11.7 LV SV:         136 LVOT Area:     4.52 cm  LV Volumes (MOD) LV vol d, MOD A2C: 186.0 ml LV vol d, MOD A4C: 122.0 ml LV vol s, MOD A2C: 70.0 ml LV vol s, MOD A4C: 63.9 ml LV SV MOD A2C:     116.0 ml LV SV MOD A4C:     122.0 ml LV SV MOD BP:      87.2 ml RIGHT VENTRICLE RV S prime:     14.00 cm/s TAPSE (M-mode): 2.1 cm LEFT ATRIUM             RIGHT ATRIUM LA diam:        4.00 cm RA Area:     15.50 cm LA Vol (A2C):   49.7 ml RA Volume:   38.40 ml LA Vol (A4C):   46.3 ml LA Biplane Vol: 49.6 ml  AORTIC VALVE LVOT Vmax:   120.00 cm/s LVOT Vmean:  85.700 cm/s LVOT VTI:    0.301 m  AORTA Ao Root diam: 3.20 cm MITRAL VALVE                TRICUSPID VALVE MV Area (PHT): 4.21 cm     TR Peak grad:   24.0 mmHg MV Decel Time: 180 msec     TR Vmax:        245.00 cm/s MV E velocity: 101.00 cm/s MV A velocity: 84.00 cm/s   SHUNTS MV E/A ratio:  1.20         Systemic VTI:  0.30 m                             Systemic Diam: 2.40 cm Orpah Cobb MD Electronically signed by Orpah Cobb MD Signature Date/Time: 06/02/2020/9:18:46 AM    Final     Assessment/Plan Active Problems:   Acute on chronic respiratory failure with hypoxia (HCC)   COVID-19 virus infection   Pneumonia due to COVID-19 virus   Acute respiratory distress syndrome (ARDS) due to COVID-19 virus  (HCC)   1. Acute on chronic respiratory failure hypoxia we will continue with pressure control mode titrate oxygen continue pulmonary toilet. 2. COVID-19 virus infection recovery we will continue to follow 3. Pneumonia due to COVID-19 treated 4. ARDS at baseline   I have personally seen and evaluated the patient, evaluated laboratory and imaging results, formulated the assessment and plan and placed orders. The Patient requires high complexity decision making with multiple systems involvement.  Rounds were done with the Respiratory Therapy Director and Staff therapists and discussed with nursing staff also.  Yevonne Pax, MD [facility] Pulmonary Critical Care Medicine Sleep Medicine

## 2020-06-03 ENCOUNTER — Other Ambulatory Visit (HOSPITAL_COMMUNITY): Payer: Medicare HMO

## 2020-06-03 DIAGNOSIS — U071 COVID-19: Secondary | ICD-10-CM | POA: Diagnosis not present

## 2020-06-03 DIAGNOSIS — Z9911 Dependence on respirator [ventilator] status: Secondary | ICD-10-CM | POA: Diagnosis not present

## 2020-06-03 DIAGNOSIS — J80 Acute respiratory distress syndrome: Secondary | ICD-10-CM | POA: Diagnosis not present

## 2020-06-03 DIAGNOSIS — J9621 Acute and chronic respiratory failure with hypoxia: Secondary | ICD-10-CM | POA: Diagnosis not present

## 2020-06-03 LAB — RENAL FUNCTION PANEL
Albumin: 2.1 g/dL — ABNORMAL LOW (ref 3.5–5.0)
Anion gap: 12 (ref 5–15)
BUN: 16 mg/dL (ref 8–23)
CO2: 34 mmol/L — ABNORMAL HIGH (ref 22–32)
Calcium: 8.7 mg/dL — ABNORMAL LOW (ref 8.9–10.3)
Chloride: 96 mmol/L — ABNORMAL LOW (ref 98–111)
Creatinine, Ser: 0.41 mg/dL — ABNORMAL LOW (ref 0.44–1.00)
GFR, Estimated: >60 mL/min (ref >60)
Glucose, Bld: 120 mg/dL — ABNORMAL HIGH (ref 70–99)
Phosphorus: 3.2 mg/dL (ref 2.5–4.6)
Potassium: 3.5 mmol/L (ref 3.5–5.1)
Sodium: 142 mmol/L (ref 135–145)

## 2020-06-03 LAB — CBC
HCT: 29.5 % — ABNORMAL LOW (ref 36.0–46.0)
Hemoglobin: 9.1 g/dL — ABNORMAL LOW (ref 12.0–15.0)
MCH: 28 pg (ref 26.0–34.0)
MCHC: 30.8 g/dL (ref 30.0–36.0)
MCV: 90.8 fL (ref 80.0–100.0)
Platelets: 349 10*3/uL (ref 150–400)
RBC: 3.25 MIL/uL — ABNORMAL LOW (ref 3.87–5.11)
RDW: 17.3 % — ABNORMAL HIGH (ref 11.5–15.5)
WBC: 8 10*3/uL (ref 4.0–10.5)
nRBC: 0 % (ref 0.0–0.2)

## 2020-06-03 LAB — MAGNESIUM: Magnesium: 1.7 mg/dL (ref 1.7–2.4)

## 2020-06-03 MED ORDER — IOHEXOL 300 MG/ML  SOLN
50.0000 mL | Freq: Once | INTRAMUSCULAR | Status: AC | PRN
  Administered 2020-06-03: 12 mL

## 2020-06-03 MED ORDER — LIDOCAINE VISCOUS HCL 2 % MT SOLN
15.0000 mL | Freq: Once | OROMUCOSAL | Status: AC
  Administered 2020-06-03: 3 mL via OROMUCOSAL

## 2020-06-03 NOTE — Progress Notes (Signed)
Pulmonary Critical Care Medicine [facility] [place]   PULMONARY CRITICAL CARE SERVICE  PROGRESS NOTE  Date of Service: 06/03/2020  Andrea Fuller  CXK:[id number]  DOB: [birth year]   DOA: 05/21/2020  Referring Physician: Carron Curie, MD  HPI: Andrea Fuller is a 65 y.o. female seen for follow up of Acute on Chronic Respiratory Failure.  Patient is on pressure assist control full support has been failing weaning attempt  Medications: Reviewed on Rounds  Physical Exam:  Vitals: Temperature is 98.9 pulse 77 respiratory 23 blood pressure is 127/63 saturations 100%  Ventilator Settings on pressure assist control FiO2 is 50% IP 14 PEEP 5  . General: Comfortable at this time . Eyes: Grossly normal lids, irises & conjunctiva . ENT: grossly tongue is normal . Neck: no obvious mass . Cardiovascular: S1 S2 normal no gallop . Respiratory: Scattered rhonchi expansion is equal . Abdomen: soft . Skin: no rash seen on limited exam . Musculoskeletal: not rigid . Psychiatric:unable to assess . Neurologic: no seizure no involuntary movements         Lab Data:   Basic Metabolic Panel: Recent Labs  Lab 05/29/20 0435 05/30/20 0636 06/01/20 0445 06/02/20 0444 06/03/20 0422  NA 140  --  141  --  142  K 4.0  --  3.6  --  3.5  CL 100  --  100  --  96*  CO2 29  --  33*  --  34*  GLUCOSE 119*  --  141*  --  120*  BUN 21  --  14  --  16  CREATININE 0.47  --  0.42*  --  0.41*  CALCIUM 8.7*  --  8.7*  --  8.7*  MG  --  1.8 1.7 2.0 1.7  PHOS 3.5 3.1 3.7 3.6 3.2    ABG: Recent Labs  Lab 06/02/20 1245  PHART 7.368  PCO2ART 58.3*  PO2ART 253*  HCO3 32.9*  O2SAT 99.7    Liver Function Tests: Recent Labs  Lab 05/29/20 0435 06/01/20 0445 06/03/20 0422  ALBUMIN 1.8* 2.1* 2.1*   No results for input(s): LIPASE, AMYLASE in the last 168 hours. No results for input(s): AMMONIA in the last 168 hours.  CBC: Recent Labs  Lab 05/28/20 0429 05/29/20 0435 05/30/20 0636  06/01/20 0445 06/03/20 0422  WBC 10.8* 6.2 5.7 9.6 8.0  HGB 8.0* 7.8* 8.4* 9.2* 9.1*  HCT 27.9* 27.3* 28.7* 31.7* 29.5*  MCV 95.5 95.1 93.5 92.7 90.8  PLT 159 176 228 338 349    Cardiac Enzymes: No results for input(s): CKTOTAL, CKMB, CKMBINDEX, TROPONINI in the last 168 hours.  BNP (last 3 results) No results for input(s): BNP in the last 8760 hours.  ProBNP (last 3 results) No results for input(s): PROBNP in the last 8760 hours.  Radiological Exams: DG CHEST PORT 1 VIEW  Result Date: 06/03/2020 CLINICAL DATA:  Pneumonia. EXAM: PORTABLE CHEST 1 VIEW COMPARISON:  Yesterday FINDINGS: Tracheostomy tube in place. Confluent bilateral airspace disease. Negative for air leak. Stable cardiomegaly accentuated by technique. IMPRESSION: Stable confluent pneumonia. Electronically Signed   By: Marnee Spring M.D.   On: 06/03/2020 06:27   DG CHEST PORT 1 VIEW  Result Date: 06/02/2020 CLINICAL DATA:  Pneumonia. EXAM: PORTABLE CHEST 1 VIEW COMPARISON:  May 31, 2020. FINDINGS: Stable cardiomediastinal silhouette. Tracheostomy tube is unchanged in position. No pneumothorax or pleural effusion is noted. Stable bilateral lung opacities are noted concerning for multifocal pneumonia. Bony thorax is unremarkable. IMPRESSION: Stable bilateral lung opacities are  noted concerning for multifocal pneumonia. Electronically Signed   By: Lupita Raider M.D.   On: 06/02/2020 13:01    Assessment/Plan Active Problems:   Acute on chronic respiratory failure with hypoxia (HCC)   COVID-19 virus infection   Pneumonia due to COVID-19 virus   Acute respiratory distress syndrome (ARDS) due to COVID-19 virus (HCC)   1. Acute on chronic respiratory failure hypoxia we will continue with pressure assist control titrate oxygen continue pulmonary toilet. 2. COVID-19 virus infection she has advanced pulmonary disease as a result we will continue with supportive care 3. Pneumonia due to COVID-19 has been treated still  with residual damage noted as above 4. ARDS no significant improvement   I have personally seen and evaluated the patient, evaluated laboratory and imaging results, formulated the assessment and plan and placed orders. The Patient requires high complexity decision making with multiple systems involvement.  Rounds were done with the Respiratory Therapy Director and Staff therapists and discussed with nursing staff also.  Yevonne Pax, MD [facility] Pulmonary Critical Care Medicine Sleep Medicine

## 2020-06-04 DIAGNOSIS — Z9911 Dependence on respirator [ventilator] status: Secondary | ICD-10-CM | POA: Diagnosis not present

## 2020-06-04 DIAGNOSIS — J80 Acute respiratory distress syndrome: Secondary | ICD-10-CM | POA: Diagnosis not present

## 2020-06-04 DIAGNOSIS — U071 COVID-19: Secondary | ICD-10-CM | POA: Diagnosis not present

## 2020-06-04 DIAGNOSIS — J9621 Acute and chronic respiratory failure with hypoxia: Secondary | ICD-10-CM | POA: Diagnosis not present

## 2020-06-04 LAB — MAGNESIUM: Magnesium: 1.7 mg/dL (ref 1.7–2.4)

## 2020-06-04 NOTE — Progress Notes (Signed)
Pulmonary Critical Care Medicine [facility] [place]   PULMONARY CRITICAL CARE SERVICE  PROGRESS NOTE  Date of Service: 06/04/2020  Andrea Fuller  GTX:[id number]  DOB: May 02, 1956   DOA: 05/21/2020  Referring Physician: Carron Curie, MD  HPI: Andrea Fuller is a 65 y.o. female seen for follow up of Acute on Chronic Respiratory Failure.  Patient is on pressure control currently on 45% FiO2 good saturations are noted  Medications: Reviewed on Rounds  Physical Exam:  Vitals: Temperature is 98.1 pulse 67 respiratory 25 blood pressure is 102/52 saturations 100%  Ventilator Settings on pressure control FiO2 45% PEEP 5 IP 14  . General: Comfortable at this time . Eyes: Grossly normal lids, irises & conjunctiva . ENT: grossly tongue is normal . Neck: no obvious mass . Cardiovascular: S1 S2 normal no gallop . Respiratory: Scattered rhonchi very coarse breath sounds . Abdomen: soft . Skin: no rash seen on limited exam . Musculoskeletal: not rigid . Psychiatric:unable to assess . Neurologic: no seizure no involuntary movements         Lab Data:   Basic Metabolic Panel: Recent Labs  Lab 05/29/20 0435 05/30/20 0636 06/01/20 0445 06/02/20 0444 06/03/20 0422 06/04/20 0416  NA 140  --  141  --  142  --   K 4.0  --  3.6  --  3.5  --   CL 100  --  100  --  96*  --   CO2 29  --  33*  --  34*  --   GLUCOSE 119*  --  141*  --  120*  --   BUN 21  --  14  --  16  --   CREATININE 0.47  --  0.42*  --  0.41*  --   CALCIUM 8.7*  --  8.7*  --  8.7*  --   MG  --  1.8 1.7 2.0 1.7 1.7  PHOS 3.5 3.1 3.7 3.6 3.2  --     ABG: Recent Labs  Lab 06/02/20 1245  PHART 7.368  PCO2ART 58.3*  PO2ART 253*  HCO3 32.9*  O2SAT 99.7    Liver Function Tests: Recent Labs  Lab 05/29/20 0435 06/01/20 0445 06/03/20 0422  ALBUMIN 1.8* 2.1* 2.1*   No results for input(s): LIPASE, AMYLASE in the last 168 hours. No results for input(s): AMMONIA in the last 168 hours.  CBC: Recent Labs   Lab 05/29/20 0435 05/30/20 0636 06/01/20 0445 06/03/20 0422  WBC 6.2 5.7 9.6 8.0  HGB 7.8* 8.4* 9.2* 9.1*  HCT 27.3* 28.7* 31.7* 29.5*  MCV 95.1 93.5 92.7 90.8  PLT 176 228 338 349    Cardiac Enzymes: No results for input(s): CKTOTAL, CKMB, CKMBINDEX, TROPONINI in the last 168 hours.  BNP (last 3 results) No results for input(s): BNP in the last 8760 hours.  ProBNP (last 3 results) No results for input(s): PROBNP in the last 8760 hours.  Radiological Exams: DG Abd 1 View  Result Date: 06/03/2020 CLINICAL DATA:  Feeding tube placement. EXAM: ABDOMEN - 1 VIEW; DG NASO G TUBE PLC W/FL-NO RAD COMPARISON:  May 21, 2020. FLUOROSCOPY TIME:  Radiation Exposure Index (as provided by the fluoroscopic device): 34.2 mGy Fluoroscopy Time:  2 minutes, 54 seconds Number of Acquired Images:  0 FINDINGS: Successful placement of the feeding tube into the proximal jejunum with the tip coiled near the ligament of Treitz. 12 mL Omnipaque 300 contrast injected through the tube confirms intraluminal placement. IMPRESSION: Feeding tube tip in the proximal  jejunum near the ligament of Treitz. Electronically Signed   By: Obie Dredge M.D.   On: 06/03/2020 15:28   DG CHEST PORT 1 VIEW  Result Date: 06/03/2020 CLINICAL DATA:  Pneumonia. EXAM: PORTABLE CHEST 1 VIEW COMPARISON:  Yesterday FINDINGS: Tracheostomy tube in place. Confluent bilateral airspace disease. Negative for air leak. Stable cardiomegaly accentuated by technique. IMPRESSION: Stable confluent pneumonia. Electronically Signed   By: Marnee Spring M.D.   On: 06/03/2020 06:27   DG CHEST PORT 1 VIEW  Result Date: 06/02/2020 CLINICAL DATA:  Pneumonia. EXAM: PORTABLE CHEST 1 VIEW COMPARISON:  May 31, 2020. FINDINGS: Stable cardiomediastinal silhouette. Tracheostomy tube is unchanged in position. No pneumothorax or pleural effusion is noted. Stable bilateral lung opacities are noted concerning for multifocal pneumonia. Bony thorax is  unremarkable. IMPRESSION: Stable bilateral lung opacities are noted concerning for multifocal pneumonia. Electronically Signed   By: Lupita Raider M.D.   On: 06/02/2020 13:01   DG Abd Portable 1V  Result Date: 06/03/2020 CLINICAL DATA:  NG tube placement EXAM: PORTABLE ABDOMEN - 1 VIEW COMPARISON:  Earlier same day FINDINGS: Enteric tube has been pulled back and now passes through the stomach to the ligament of Treitz. IMPRESSION: Enteric tube tip at the ligament of Treitz. Electronically Signed   By: Guadlupe Spanish M.D.   On: 06/03/2020 21:38   DG Abd Portable 1V  Result Date: 06/03/2020 CLINICAL DATA:  Check feeding catheter placement EXAM: PORTABLE ABDOMEN - 1 VIEW COMPARISON:  Film from earlier in the same day. FINDINGS: Feeding catheter is identified extending through the stomach and into the proximal jejunum. The distal aspect of the catheter is kinked and doubled over in the proximal jejunum. IMPRESSION: Feeding catheter doubled over on itself with some kinking. Electronically Signed   By: Alcide Clever M.D.   On: 06/03/2020 20:36   DG Basil Dess Tube Plc W/Fl-No Rad  Result Date: 06/03/2020 CLINICAL DATA:  Feeding tube placement. EXAM: ABDOMEN - 1 VIEW; DG NASO G TUBE PLC W/FL-NO RAD COMPARISON:  May 21, 2020. FLUOROSCOPY TIME:  Radiation Exposure Index (as provided by the fluoroscopic device): 34.2 mGy Fluoroscopy Time:  2 minutes, 54 seconds Number of Acquired Images:  0 FINDINGS: Successful placement of the feeding tube into the proximal jejunum with the tip coiled near the ligament of Treitz. 12 mL Omnipaque 300 contrast injected through the tube confirms intraluminal placement. IMPRESSION: Feeding tube tip in the proximal jejunum near the ligament of Treitz. Electronically Signed   By: Obie Dredge M.D.   On: 06/03/2020 15:28    Assessment/Plan Active Problems:   Acute on chronic respiratory failure with hypoxia (HCC)   COVID-19 virus infection   Pneumonia due to COVID-19 virus    Acute respiratory distress syndrome (ARDS) due to COVID-19 virus (HCC)   1. Acute on chronic respiratory failure hypoxia we will continue with pressure control titrate oxygen patient's mechanics are poor not able to wean. 2. COVID-19 virus infection recovery we will continue to follow 3. Pneumonia due to COVID-19 treated 4. ARDS treated continue with supportive care   I have personally seen and evaluated the patient, evaluated laboratory and imaging results, formulated the assessment and plan and placed orders. The Patient requires high complexity decision making with multiple systems involvement.  Rounds were done with the Respiratory Therapy Director and Staff therapists and discussed with nursing staff also.  Yevonne Pax, MD [facility] Pulmonary Critical Care Medicine Sleep Medicine

## 2020-06-05 DIAGNOSIS — J80 Acute respiratory distress syndrome: Secondary | ICD-10-CM | POA: Diagnosis not present

## 2020-06-05 DIAGNOSIS — J9621 Acute and chronic respiratory failure with hypoxia: Secondary | ICD-10-CM | POA: Diagnosis not present

## 2020-06-05 DIAGNOSIS — Z9911 Dependence on respirator [ventilator] status: Secondary | ICD-10-CM | POA: Diagnosis not present

## 2020-06-05 DIAGNOSIS — U071 COVID-19: Secondary | ICD-10-CM | POA: Diagnosis not present

## 2020-06-05 LAB — BASIC METABOLIC PANEL
Anion gap: 11 (ref 5–15)
BUN: 24 mg/dL — ABNORMAL HIGH (ref 8–23)
CO2: 31 mmol/L (ref 22–32)
Calcium: 8.6 mg/dL — ABNORMAL LOW (ref 8.9–10.3)
Chloride: 100 mmol/L (ref 98–111)
Creatinine, Ser: 0.49 mg/dL (ref 0.44–1.00)
GFR, Estimated: >60 mL/min (ref >60)
Glucose, Bld: 108 mg/dL — ABNORMAL HIGH (ref 70–99)
Potassium: 4 mmol/L (ref 3.5–5.1)
Sodium: 142 mmol/L (ref 135–145)

## 2020-06-05 LAB — CBC
HCT: 31.6 % — ABNORMAL LOW (ref 36.0–46.0)
Hemoglobin: 9.1 g/dL — ABNORMAL LOW (ref 12.0–15.0)
MCH: 26.9 pg (ref 26.0–34.0)
MCHC: 28.8 g/dL — ABNORMAL LOW (ref 30.0–36.0)
MCV: 93.5 fL (ref 80.0–100.0)
Platelets: 242 10*3/uL (ref 150–400)
RBC: 3.38 MIL/uL — ABNORMAL LOW (ref 3.87–5.11)
RDW: 17.1 % — ABNORMAL HIGH (ref 11.5–15.5)
WBC: 9.6 10*3/uL (ref 4.0–10.5)
nRBC: 0 % (ref 0.0–0.2)

## 2020-06-05 LAB — MAGNESIUM: Magnesium: 1.8 mg/dL (ref 1.7–2.4)

## 2020-06-05 LAB — PHOSPHORUS: Phosphorus: 3.8 mg/dL (ref 2.5–4.6)

## 2020-06-05 LAB — TRIGLYCERIDES: Triglycerides: 163 mg/dL — ABNORMAL HIGH (ref <150)

## 2020-06-05 LAB — VANCOMYCIN, TROUGH: Vancomycin Tr: 21 ug/mL (ref 15–20)

## 2020-06-05 NOTE — Progress Notes (Signed)
Pulmonary Critical Care Medicine [facility] [place]   PULMONARY CRITICAL CARE SERVICE  PROGRESS NOTE  Date of Service: 06/05/2020  Andrea Fuller  ZOX:[id number]  DOB: 1955-05-26   DOA: 05/21/2020  Referring Physician: Carron Curie, MD  HPI: Andrea Fuller is a 65 y.o. female seen for follow up of Acute on Chronic Respiratory Failure.  Patient currently is on pressure control full support on 45% FiO2 with a PEEP of 5  Medications: Reviewed on Rounds  Physical Exam:  Vitals: Temperature is 97.7 pulse 64 respiratory 20 blood pressure is 134/62 saturations 100%  Ventilator Settings on pressure control FiO2 45% PEEP 5 IP 14  . General: Comfortable at this time . Eyes: Grossly normal lids, irises & conjunctiva . ENT: grossly tongue is normal . Neck: no obvious mass . Cardiovascular: S1 S2 normal no gallop . Respiratory: Coarse breath sounds with a few scattered rhonchi . Abdomen: soft . Skin: no rash seen on limited exam . Musculoskeletal: not rigid . Psychiatric:unable to assess . Neurologic: no seizure no involuntary movements         Lab Data:   Basic Metabolic Panel: Recent Labs  Lab 05/30/20 0636 06/01/20 0445 06/02/20 0444 06/03/20 0422 06/04/20 0416 06/05/20 0511  NA  --  141  --  142  --  142  K  --  3.6  --  3.5  --  4.0  CL  --  100  --  96*  --  100  CO2  --  33*  --  34*  --  31  GLUCOSE  --  141*  --  120*  --  108*  BUN  --  14  --  16  --  24*  CREATININE  --  0.42*  --  0.41*  --  0.49  CALCIUM  --  8.7*  --  8.7*  --  8.6*  MG 1.8 1.7 2.0 1.7 1.7 1.8  PHOS 3.1 3.7 3.6 3.2  --  3.8    ABG: Recent Labs  Lab 06/02/20 1245  PHART 7.368  PCO2ART 58.3*  PO2ART 253*  HCO3 32.9*  O2SAT 99.7    Liver Function Tests: Recent Labs  Lab 06/01/20 0445 06/03/20 0422  ALBUMIN 2.1* 2.1*   No results for input(s): LIPASE, AMYLASE in the last 168 hours. No results for input(s): AMMONIA in the last 168 hours.  CBC: Recent Labs  Lab  05/30/20 0636 06/01/20 0445 06/03/20 0422 06/05/20 0511  WBC 5.7 9.6 8.0 9.6  HGB 8.4* 9.2* 9.1* 9.1*  HCT 28.7* 31.7* 29.5* 31.6*  MCV 93.5 92.7 90.8 93.5  PLT 228 338 349 242    Cardiac Enzymes: No results for input(s): CKTOTAL, CKMB, CKMBINDEX, TROPONINI in the last 168 hours.  BNP (last 3 results) No results for input(s): BNP in the last 8760 hours.  ProBNP (last 3 results) No results for input(s): PROBNP in the last 8760 hours.  Radiological Exams: DG Abd 1 View  Result Date: 06/03/2020 CLINICAL DATA:  Feeding tube placement. EXAM: ABDOMEN - 1 VIEW; DG NASO G TUBE PLC W/FL-NO RAD COMPARISON:  May 21, 2020. FLUOROSCOPY TIME:  Radiation Exposure Index (as provided by the fluoroscopic device): 34.2 mGy Fluoroscopy Time:  2 minutes, 54 seconds Number of Acquired Images:  0 FINDINGS: Successful placement of the feeding tube into the proximal jejunum with the tip coiled near the ligament of Treitz. 12 mL Omnipaque 300 contrast injected through the tube confirms intraluminal placement. IMPRESSION: Feeding tube tip in the proximal  jejunum near the ligament of Treitz. Electronically Signed   By: Obie Dredge M.D.   On: 06/03/2020 15:28   DG Abd Portable 1V  Result Date: 06/03/2020 CLINICAL DATA:  NG tube placement EXAM: PORTABLE ABDOMEN - 1 VIEW COMPARISON:  Earlier same day FINDINGS: Enteric tube has been pulled back and now passes through the stomach to the ligament of Treitz. IMPRESSION: Enteric tube tip at the ligament of Treitz. Electronically Signed   By: Guadlupe Spanish M.D.   On: 06/03/2020 21:38   DG Abd Portable 1V  Result Date: 06/03/2020 CLINICAL DATA:  Check feeding catheter placement EXAM: PORTABLE ABDOMEN - 1 VIEW COMPARISON:  Film from earlier in the same day. FINDINGS: Feeding catheter is identified extending through the stomach and into the proximal jejunum. The distal aspect of the catheter is kinked and doubled over in the proximal jejunum. IMPRESSION: Feeding  catheter doubled over on itself with some kinking. Electronically Signed   By: Alcide Clever M.D.   On: 06/03/2020 20:36   DG Basil Dess Tube Plc W/Fl-No Rad  Result Date: 06/03/2020 CLINICAL DATA:  Feeding tube placement. EXAM: ABDOMEN - 1 VIEW; DG NASO G TUBE PLC W/FL-NO RAD COMPARISON:  May 21, 2020. FLUOROSCOPY TIME:  Radiation Exposure Index (as provided by the fluoroscopic device): 34.2 mGy Fluoroscopy Time:  2 minutes, 54 seconds Number of Acquired Images:  0 FINDINGS: Successful placement of the feeding tube into the proximal jejunum with the tip coiled near the ligament of Treitz. 12 mL Omnipaque 300 contrast injected through the tube confirms intraluminal placement. IMPRESSION: Feeding tube tip in the proximal jejunum near the ligament of Treitz. Electronically Signed   By: Obie Dredge M.D.   On: 06/03/2020 15:28    Assessment/Plan Active Problems:   Acute on chronic respiratory failure with hypoxia (HCC)   COVID-19 virus infection   Pneumonia due to COVID-19 virus   Acute respiratory distress syndrome (ARDS) due to COVID-19 virus (HCC)   1. Acute on chronic respiratory failure hypoxia we will continue with try to wean on pressure support once again. 2. COVID-19 virus infection 3. Pneumonia due to COVID-19 treated 4. Acute respiratory distress no change we will continue to follow   I have personally seen and evaluated the patient, evaluated laboratory and imaging results, formulated the assessment and plan and placed orders. The Patient requires high complexity decision making with multiple systems involvement.  Rounds were done with the Respiratory Therapy Director and Staff therapists and discussed with nursing staff also.  Yevonne Pax, MD [facility] Pulmonary Critical Care Medicine Sleep Medicine

## 2020-06-06 ENCOUNTER — Other Ambulatory Visit (HOSPITAL_COMMUNITY): Payer: Medicare HMO

## 2020-06-06 DIAGNOSIS — J9621 Acute and chronic respiratory failure with hypoxia: Secondary | ICD-10-CM | POA: Diagnosis not present

## 2020-06-06 DIAGNOSIS — J80 Acute respiratory distress syndrome: Secondary | ICD-10-CM | POA: Diagnosis not present

## 2020-06-06 DIAGNOSIS — Z9911 Dependence on respirator [ventilator] status: Secondary | ICD-10-CM | POA: Diagnosis not present

## 2020-06-06 DIAGNOSIS — U071 COVID-19: Secondary | ICD-10-CM | POA: Diagnosis not present

## 2020-06-06 LAB — BASIC METABOLIC PANEL
Anion gap: 8 (ref 5–15)
BUN: 18 mg/dL (ref 8–23)
CO2: 37 mmol/L — ABNORMAL HIGH (ref 22–32)
Calcium: 8.5 mg/dL — ABNORMAL LOW (ref 8.9–10.3)
Chloride: 95 mmol/L — ABNORMAL LOW (ref 98–111)
Creatinine, Ser: 0.42 mg/dL — ABNORMAL LOW (ref 0.44–1.00)
GFR, Estimated: >60 mL/min (ref >60)
Glucose, Bld: 106 mg/dL — ABNORMAL HIGH (ref 70–99)
Potassium: 3.8 mmol/L (ref 3.5–5.1)
Sodium: 140 mmol/L (ref 135–145)

## 2020-06-06 LAB — PHOSPHORUS: Phosphorus: 3.5 mg/dL (ref 2.5–4.6)

## 2020-06-06 LAB — MAGNESIUM: Magnesium: 1.7 mg/dL (ref 1.7–2.4)

## 2020-06-06 NOTE — Progress Notes (Signed)
PROGRESS NOTE    Andrea Fuller  WHQ:[id number] DOB: 22-Jan-1956 DOA: 05/21/2020   Brief Narrative:  Andrea Fuller is an 65 y.o. female with medical history significant of morbid obesity, hypertension, hyperlipidemia, osteoarthritis with history of cortisone injection to both knees who was admitted to the acute facility with worsening shortness of breath, respiratory failure.  She was diagnosed to have COVID-19 infection.  She was given treatment with remdesivir, steroids.  She also had acute kidney injury and was given IV fluids.  She subsequently developed ARDS eventually requiring intubation ventilation, tracheostomy and PEG tube placement.  She was also found to have bilateral lower extremity DVTs, started on anticoagulation. Per records from the outside facility she had some drainage around the PEG tube site.  She was started on antibiotics however, cultures were all negative therefore antibiotics were discontinued.  Due to her complex medical problems she was transferred and admitted to [facility] on 05/21/2020.  She was noted to have induration around the PEG tube site.  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  She was also found to have bibasilar pneumonia with pleural effusions.  PEG tube was removed.  However, she had severe induration around the PEG tube site.  Therefore she was started on antibiotics. -She is still having some drainage and induration at the site. She remains on the vent, on 40% FiO2, 5 of PEEP.  She is complaining of increased secretions and reports needing frequent suctioning.  She also had some loose stools per primary team.  Assessment & Plan: Acute on chronic  respiratory failure with hypoxia, vent dependent Abdominal wall cellulitis COVID-19 virus infection Pneumonia due to COVID-19 virus UTI with E. Coli   Acute respiratory distress syndrome (ARDS) due to COVID-19 virus (HCC) Systemic inflammatory response syndrome Bilateral lower extremity DVT History of depression Morbid obesity Anemia Dysphagia  Acute on chronic respiratory failure with hypoxemia: Patient initially had COVID-19 infection resulting in respiratory failure.  Currently has trach in place, vent dependent.  Unfortunately she also developed ARDS.  She also had probable secondary bacterial pneumonia for which she received antibiotics at the outside facility.  However, cultures were negative therefore antibiotics were discontinued.  Here she had fever, systemic inflammatory response syndrome.  Respiratory cultures showed staph epidermidis.  Previously was on treatment with IV vancomycin, cefepime, Flagyl.  Antibiotics were switched to IV vancomycin, Ancef for the abdominal wall cellulitis and induration at the site of the previous PEG tube.  -She is continuing to have increased secretions.  Remains on the vent.  Chest x-ray continuing to show infiltrates/opacities, unchanged.  Extending the vancomycin duration. Pulmonary following.  Monitor BUN/creatinine closely while on antibiotics.  Abdominal wall cellulitis: Patient had severe cellulitis with induration likely secondary to displacement of the PEG tube.  CT findings as mentioned above. PEG tube has been removed.  She is on treatment with IV vancomycin, Ancef.  However, still has induration and some drainage.  Agree with adding Flagyl.  Suggest to extend antibiotic treatment for another week and continue to monitor the area closely.    COVID-19 infection: Patient was treated with remdesivir, steroids at the acute facility.  Continue supportive management per primary team.  Pneumonia: Respiratory cultures showing staph.  On treatment  with IV vancomycin.  She unfortunately also likely has ARDS from the COVID-19 infection.  She has dysphagia and high risk for aspiration and recurrent aspiration pneumonia. She also has severe cellulitis with induration at the PEG tube site.  Extending antibiotics as mentioned above. Please monitor BUN/cr closely while on antibiotics.  - If her respiratory status worsens suggest to repeat chest imaging preferably chest CT to better evaluate and send for repeat respiratory cultures.  UTI: Urine culture showed E. coli.  Susceptible to Ancef.  Treated with antibiotics as mentioned above.  History of depression: Patient apparently was on multiple medications for her depression. Monitor closely.  Further management per primary team.  Bilateral lower extremity DVT: On anticoagulation per the primary team.  Dysphagia: Unfortunately due to her dysphagia she is high risk for aspiration and aspiration pneumonia.  Due to her complex medical problems she is very high risk for worsening and decompensation.  Plan of care discussed with patient and primary team.   Subjective: She is continuing to have increased secretions.  She is asking for frequent suctioning.  She still has induration in the abdominal area although improving still has quite a bit of drainage.  Objective: Vitals: Temperature 96.8, respiratory rate 27, pulse 66, blood pressure 129/60, pulse oximetry 100% on 40% FiO2, 5 of PEEP  Examination: Constitutional: Ill-appearing female, on vent. Head: Atraumatic, normocephalic Eyes: PERLA ENMT: external ears and nose appear normal,Lips appears normal, has DHT, moist oral mucosa  Neck: Has trach in place CVS: S1-S2 Respiratory: Coarse breath sounds, scattered rhonchi, no wheezing Abdomen: Obese, prior PEG tube site with drainage, surrounding induration with drainage, dressing in place, non tender, positive bowel sounds Musculoskeletal: Edema Neuro:  Debility with generalized  weakness Psych:  Stable Skin: Erythema, induration around the previous PEG tube site     Data Reviewed: I have personally reviewed following labs and imaging studies  CBC: Recent Labs  Lab 06/01/20 0445 06/03/20 0422 06/05/20 0511  WBC 9.6 8.0 9.6  HGB 9.2* 9.1* 9.1*  HCT 31.7* 29.5* 31.6*  MCV 92.7 90.8 93.5  PLT 338 349 242    Basic Metabolic Panel: Recent Labs  Lab 06/01/20 0445 06/02/20 0444 06/03/20 0422 06/04/20 0416 06/05/20 0511 06/06/20 0549  NA 141  --  142  --  142 140  K 3.6  --  3.5  --  4.0 3.8  CL 100  --  96*  --  100 95*  CO2 33*  --  34*  --  31 37*  GLUCOSE 141*  --  120*  --  108* 106*  BUN 14  --  16  --  24* 18  CREATININE 0.42*  --  0.41*  --  0.49 0.42*  CALCIUM 8.7*  --  8.7*  --  8.6* 8.5*  MG 1.7 2.0 1.7 1.7 1.8 1.7  PHOS 3.7 3.6 3.2  --  3.8 3.5    GFR: CrCl cannot be calculated (Unknown ideal weight.).  Liver Function Tests: Recent Labs  Lab 06/01/20 0445 06/03/20 0422  ALBUMIN 2.1* 2.1*    CBG: No results for input(s): GLUCAP in the last 168 hours.   No results found for this or any previous visit (from the past 240 hour(s)).       Radiology Studies: DG Chest Port 1 View  Result Date: 06/06/2020 CLINICAL DATA:  Pneumonia EXAM: PORTABLE CHEST 1 VIEW COMPARISON:  05/04/2020 FINDINGS: Tracheostomy and feeding tubes in unremarkable position. Confluent pulmonary opacity is unchanged. Apparent cardiomegaly.  No visible effusion or pneumothorax. IMPRESSION: Unremarkable hardware positioning.  Stable confluent infiltrates. Electronically Signed   By: Marnee Spring M.D.   On: 06/06/2020 06:13    Scheduled Meds: Please see MAR    Vonzella Nipple, MD 06/06/2020, 6:15 PM

## 2020-06-06 NOTE — Progress Notes (Signed)
Pulmonary Critical Care Medicine [facility] [place]   PULMONARY CRITICAL CARE SERVICE  PROGRESS NOTE  Date of Service: 06/06/2020  Andrea Fuller  OMV:[id number]  DOB: [birth year]   DOA: 05/21/2020  Referring Physician: Carron Curie, MD  HPI: Andrea Fuller is a 65 y.o. female seen for follow up of Acute on Chronic Respiratory Failure.  Patient this morning was on pressure support 12/5 the goal is to go up to 8 hours  Medications: Reviewed on Rounds  Physical Exam:  Vitals: Temperature is 96.3 pulse 54 respiratory 19 blood pressure is 104/50 saturations 100%  Ventilator Settings on pressure support FiO2 40%  . General: Comfortable at this time . Eyes: Grossly normal lids, irises & conjunctiva . ENT: grossly tongue is normal . Neck: no obvious mass . Cardiovascular: S1 S2 normal no gallop . Respiratory: Scattered rhonchi expansion is equal . Abdomen: soft . Skin: no rash seen on limited exam . Musculoskeletal: not rigid . Psychiatric:unable to assess . Neurologic: no seizure no involuntary movements         Lab Data:   Basic Metabolic Panel: Recent Labs  Lab 06/01/20 0445 06/02/20 0444 06/03/20 0422 06/04/20 0416 06/05/20 0511 06/06/20 0549  NA 141  --  142  --  142 140  K 3.6  --  3.5  --  4.0 3.8  CL 100  --  96*  --  100 95*  CO2 33*  --  34*  --  31 37*  GLUCOSE 141*  --  120*  --  108* 106*  BUN 14  --  16  --  24* 18  CREATININE 0.42*  --  0.41*  --  0.49 0.42*  CALCIUM 8.7*  --  8.7*  --  8.6* 8.5*  MG 1.7 2.0 1.7 1.7 1.8 1.7  PHOS 3.7 3.6 3.2  --  3.8 3.5    ABG: Recent Labs  Lab 06/02/20 1245  PHART 7.368  PCO2ART 58.3*  PO2ART 253*  HCO3 32.9*  O2SAT 99.7    Liver Function Tests: Recent Labs  Lab 06/01/20 0445 06/03/20 0422  ALBUMIN 2.1* 2.1*   No results for input(s): LIPASE, AMYLASE in the last 168 hours. No results for input(s): AMMONIA in the last 168 hours.  CBC: Recent Labs  Lab 06/01/20 0445 06/03/20 0422  06/05/20 0511  WBC 9.6 8.0 9.6  HGB 9.2* 9.1* 9.1*  HCT 31.7* 29.5* 31.6*  MCV 92.7 90.8 93.5  PLT 338 349 242    Cardiac Enzymes: No results for input(s): CKTOTAL, CKMB, CKMBINDEX, TROPONINI in the last 168 hours.  BNP (last 3 results) No results for input(s): BNP in the last 8760 hours.  ProBNP (last 3 results) No results for input(s): PROBNP in the last 8760 hours.  Radiological Exams: DG Chest Port 1 View  Result Date: 06/06/2020 CLINICAL DATA:  Pneumonia EXAM: PORTABLE CHEST 1 VIEW COMPARISON:  05/04/2020 FINDINGS: Tracheostomy and feeding tubes in unremarkable position. Confluent pulmonary opacity is unchanged. Apparent cardiomegaly. No visible effusion or pneumothorax. IMPRESSION: Unremarkable hardware positioning.  Stable confluent infiltrates. Electronically Signed   By: Marnee Spring M.D.   On: 06/06/2020 06:13    Assessment/Plan Active Problems:   Acute on chronic respiratory failure with hypoxia (HCC)   COVID-19 virus infection   Pneumonia due to COVID-19 virus   Acute respiratory distress syndrome (ARDS) due to COVID-19 virus (HCC)   1. Acute on chronic respiratory failure hypoxia we will continue with weaning on pressure support titrate oxygen as tolerated. 2. COVID-19  virus infection recovery phase 3. Pneumonia due to COVID-19 treated slow improved 4. ARDS seems to be no change the chest x-ray still showing opacities   I have personally seen and evaluated the patient, evaluated laboratory and imaging results, formulated the assessment and plan and placed orders. The Patient requires high complexity decision making with multiple systems involvement.  Rounds were done with the Respiratory Therapy Director and Staff therapists and discussed with nursing staff also.  Yevonne Pax, MD [facility] Pulmonary Critical Care Medicine Sleep Medicine

## 2020-06-07 DIAGNOSIS — Z9911 Dependence on respirator [ventilator] status: Secondary | ICD-10-CM | POA: Diagnosis not present

## 2020-06-07 DIAGNOSIS — U071 COVID-19: Secondary | ICD-10-CM | POA: Diagnosis not present

## 2020-06-07 DIAGNOSIS — J9621 Acute and chronic respiratory failure with hypoxia: Secondary | ICD-10-CM | POA: Diagnosis not present

## 2020-06-07 DIAGNOSIS — J80 Acute respiratory distress syndrome: Secondary | ICD-10-CM | POA: Diagnosis not present

## 2020-06-07 LAB — MAGNESIUM: Magnesium: 1.8 mg/dL (ref 1.7–2.4)

## 2020-06-07 NOTE — Progress Notes (Signed)
Pulmonary Critical Care Medicine [facility] [place]   PULMONARY CRITICAL CARE SERVICE  PROGRESS NOTE  Date of Service: 06/07/2020  Andrea Fuller  ZOX:[id number]  DOB: [birth year]   DOA: 05/21/2020  Referring Physician: Carron Curie, MD  HPI: Andrea Fuller is a 65 y.o. female seen for follow up of Acute on Chronic Respiratory Failure.  Patient is on pressure support right now on 40% FiO2 the goal is for 12 hours  Medications: Reviewed on Rounds  Physical Exam:  Vitals: Temperature is 97.6 pulse 57 respiratory 25 blood pressure is 107/55 saturations 100%  Ventilator Settings on pressure support FiO2 40% pressure 12/5  . General: Comfortable at this time . Eyes: Grossly normal lids, irises & conjunctiva . ENT: grossly tongue is normal . Neck: no obvious mass . Cardiovascular: S1 S2 normal no gallop . Respiratory: Scattered rhonchi very coarse breath . Abdomen: soft . Skin: no rash seen on limited exam . Musculoskeletal: not rigid . Psychiatric:unable to assess . Neurologic: no seizure no involuntary movements         Lab Data:   Basic Metabolic Panel: Recent Labs  Lab 06/01/20 0445 06/02/20 0444 06/03/20 0422 06/04/20 0416 06/05/20 0511 06/06/20 0549  NA 141  --  142  --  142 140  K 3.6  --  3.5  --  4.0 3.8  CL 100  --  96*  --  100 95*  CO2 33*  --  34*  --  31 37*  GLUCOSE 141*  --  120*  --  108* 106*  BUN 14  --  16  --  24* 18  CREATININE 0.42*  --  0.41*  --  0.49 0.42*  CALCIUM 8.7*  --  8.7*  --  8.6* 8.5*  MG 1.7 2.0 1.7 1.7 1.8 1.7  PHOS 3.7 3.6 3.2  --  3.8 3.5    ABG: Recent Labs  Lab 06/02/20 1245  PHART 7.368  PCO2ART 58.3*  PO2ART 253*  HCO3 32.9*  O2SAT 99.7    Liver Function Tests: Recent Labs  Lab 06/01/20 0445 06/03/20 0422  ALBUMIN 2.1* 2.1*   No results for input(s): LIPASE, AMYLASE in the last 168 hours. No results for input(s): AMMONIA in the last 168 hours.  CBC: Recent Labs  Lab 06/01/20 0445  06/03/20 0422 06/05/20 0511  WBC 9.6 8.0 9.6  HGB 9.2* 9.1* 9.1*  HCT 31.7* 29.5* 31.6*  MCV 92.7 90.8 93.5  PLT 338 349 242    Cardiac Enzymes: No results for input(s): CKTOTAL, CKMB, CKMBINDEX, TROPONINI in the last 168 hours.  BNP (last 3 results) No results for input(s): BNP in the last 8760 hours.  ProBNP (last 3 results) No results for input(s): PROBNP in the last 8760 hours.  Radiological Exams: DG Chest Port 1 View  Result Date: 06/06/2020 CLINICAL DATA:  Pneumonia EXAM: PORTABLE CHEST 1 VIEW COMPARISON:  05/04/2020 FINDINGS: Tracheostomy and feeding tubes in unremarkable position. Confluent pulmonary opacity is unchanged. Apparent cardiomegaly. No visible effusion or pneumothorax. IMPRESSION: Unremarkable hardware positioning.  Stable confluent infiltrates. Electronically Signed   By: Marnee Spring M.D.   On: 06/06/2020 06:13    Assessment/Plan Active Problems:   Acute on chronic respiratory failure with hypoxia (HCC)   COVID-19 virus infection   Pneumonia due to COVID-19 virus   Acute respiratory distress syndrome (ARDS) due to COVID-19 virus (HCC)   1. Acute on chronic respiratory failure hypoxia we will continue with pressure support titrate oxygen continue pulmonary toilet 2. COVID-19  virus infection in recovery phase we will continue to follow 3. Pneumonia due to COVID-19 treated 4. Acute respiratory distress no change we will continue to follow   I have personally seen and evaluated the patient, evaluated laboratory and imaging results, formulated the assessment and plan and placed orders. The Patient requires high complexity decision making with multiple systems involvement.  Rounds were done with the Respiratory Therapy Director and Staff therapists and discussed with nursing staff also.  Yevonne Pax, MD [facility] Pulmonary Critical Care Medicine Sleep Medicine

## 2020-06-08 ENCOUNTER — Other Ambulatory Visit (HOSPITAL_COMMUNITY): Payer: Medicare HMO

## 2020-06-08 LAB — BASIC METABOLIC PANEL
Anion gap: 11 (ref 5–15)
BUN: 14 mg/dL (ref 8–23)
CO2: 33 mmol/L — ABNORMAL HIGH (ref 22–32)
Calcium: 8.5 mg/dL — ABNORMAL LOW (ref 8.9–10.3)
Chloride: 97 mmol/L — ABNORMAL LOW (ref 98–111)
Creatinine, Ser: 0.51 mg/dL (ref 0.44–1.00)
GFR, Estimated: >60 mL/min (ref >60)
Glucose, Bld: 96 mg/dL (ref 70–99)
Potassium: 4.1 mmol/L (ref 3.5–5.1)
Sodium: 141 mmol/L (ref 135–145)

## 2020-06-08 LAB — CBC
HCT: 29.7 % — ABNORMAL LOW (ref 36.0–46.0)
Hemoglobin: 9.1 g/dL — ABNORMAL LOW (ref 12.0–15.0)
MCH: 27.7 pg (ref 26.0–34.0)
MCHC: 30.6 g/dL (ref 30.0–36.0)
MCV: 90.3 fL (ref 80.0–100.0)
Platelets: 261 10*3/uL (ref 150–400)
RBC: 3.29 MIL/uL — ABNORMAL LOW (ref 3.87–5.11)
RDW: 17.2 % — ABNORMAL HIGH (ref 11.5–15.5)
WBC: 8.6 10*3/uL (ref 4.0–10.5)
nRBC: 0 % (ref 0.0–0.2)

## 2020-06-08 LAB — PHOSPHORUS: Phosphorus: 3.7 mg/dL (ref 2.5–4.6)

## 2020-06-08 LAB — MAGNESIUM: Magnesium: 1.7 mg/dL (ref 1.7–2.4)

## 2020-06-09 DIAGNOSIS — J80 Acute respiratory distress syndrome: Secondary | ICD-10-CM | POA: Diagnosis not present

## 2020-06-09 DIAGNOSIS — Z9911 Dependence on respirator [ventilator] status: Secondary | ICD-10-CM | POA: Diagnosis not present

## 2020-06-09 DIAGNOSIS — U071 COVID-19: Secondary | ICD-10-CM | POA: Diagnosis not present

## 2020-06-09 DIAGNOSIS — J9621 Acute and chronic respiratory failure with hypoxia: Secondary | ICD-10-CM | POA: Diagnosis not present

## 2020-06-09 NOTE — Progress Notes (Signed)
Pulmonary Critical Care Medicine [facility] [place]   PULMONARY CRITICAL CARE SERVICE  PROGRESS NOTE  Date of Service: 06/09/2020  Deserea Bordley  OTL:[id number]  DOB: 1955/05/22   DOA: 05/21/2020  Referring Physician: Carron Curie, MD  HPI: Francoise Chojnowski is a 65 y.o. female seen for follow up of Acute on Chronic Respiratory Failure.  The patient is currently on pressure support has been on pressure of 12/5 good saturations are noted  Medications: Reviewed on Rounds  Physical Exam:  Vitals: Temperature is 97.1 pulse 59 respiratory rate 28 blood pressure is 116/88 saturations 100%  Ventilator Settings on pressure support FiO2 is 30%  . General: Comfortable at this time . Eyes: Grossly normal lids, irises & conjunctiva . ENT: grossly tongue is normal . Neck: no obvious mass . Cardiovascular: S1 S2 normal no gallop . Respiratory: Scattered rhonchi coarse breath sounds . Abdomen: soft . Skin: no rash seen on limited exam . Musculoskeletal: not rigid . Psychiatric:unable to assess . Neurologic: no seizure no involuntary movements         Lab Data:   Basic Metabolic Panel: Recent Labs  Lab 06/03/20 0422 06/04/20 0416 06/05/20 0511 06/06/20 0549 06/07/20 1049 06/08/20 0630  NA 142  --  142 140  --  141  K 3.5  --  4.0 3.8  --  4.1  CL 96*  --  100 95*  --  97*  CO2 34*  --  31 37*  --  33*  GLUCOSE 120*  --  108* 106*  --  96  BUN 16  --  24* 18  --  14  CREATININE 0.41*  --  0.49 0.42*  --  0.51  CALCIUM 8.7*  --  8.6* 8.5*  --  8.5*  MG 1.7 1.7 1.8 1.7 1.8 1.7  PHOS 3.2  --  3.8 3.5  --  3.7    ABG: No results for input(s): PHART, PCO2ART, PO2ART, HCO3, O2SAT in the last 168 hours.  Liver Function Tests: Recent Labs  Lab 06/03/20 0422  ALBUMIN 2.1*   No results for input(s): LIPASE, AMYLASE in the last 168 hours. No results for input(s): AMMONIA in the last 168 hours.  CBC: Recent Labs  Lab 06/03/20 0422 06/05/20 0511 06/08/20 0630  WBC  8.0 9.6 8.6  HGB 9.1* 9.1* 9.1*  HCT 29.5* 31.6* 29.7*  MCV 90.8 93.5 90.3  PLT 349 242 261    Cardiac Enzymes: No results for input(s): CKTOTAL, CKMB, CKMBINDEX, TROPONINI in the last 168 hours.  BNP (last 3 results) No results for input(s): BNP in the last 8760 hours.  ProBNP (last 3 results) No results for input(s): PROBNP in the last 8760 hours.  Radiological Exams: DG Chest Port 1 View  Result Date: 06/08/2020 CLINICAL DATA:  Shortness of breath.  Ventilator dependent. EXAM: PORTABLE CHEST 1 VIEW COMPARISON:  One-view chest x-ray 06/06/2020 FINDINGS: Tracheostomy tube is stable. Feeding tube courses off the inferior border of the film. The heart is enlarged. Diffuse interstitial and airspace opacities bilaterally are similar the prior exam. IMPRESSION: Stable appearance of diffuse interstitial and airspace disease compatible with multi lobar pneumonia. Support apparatus stable. Electronically Signed   By: Marin Roberts M.D.   On: 06/08/2020 07:04    Assessment/Plan Active Problems:   Acute on chronic respiratory failure with hypoxia (HCC)   COVID-19 virus infection   Pneumonia due to COVID-19 virus   Acute respiratory distress syndrome (ARDS) due to COVID-19 virus (HCC)   1. Acute  on chronic respiratory failure hypoxia we will continue with the pressure support wean advance to T collar. 2. COVID-19 virus infection in recovery we will continue to follow 3. Pneumonia due to COVID-19 treated 4. ARDS treated slowly improving   I have personally seen and evaluated the patient, evaluated laboratory and imaging results, formulated the assessment and plan and placed orders. The Patient requires high complexity decision making with multiple systems involvement.  Rounds were done with the Respiratory Therapy Director and Staff therapists and discussed with nursing staff also.  Yevonne Pax, MD [facility] Pulmonary Critical Care Medicine Sleep Medicine

## 2020-06-10 DIAGNOSIS — U071 COVID-19: Secondary | ICD-10-CM | POA: Diagnosis not present

## 2020-06-10 DIAGNOSIS — J80 Acute respiratory distress syndrome: Secondary | ICD-10-CM | POA: Diagnosis not present

## 2020-06-10 DIAGNOSIS — Z9911 Dependence on respirator [ventilator] status: Secondary | ICD-10-CM | POA: Diagnosis not present

## 2020-06-10 DIAGNOSIS — J9621 Acute and chronic respiratory failure with hypoxia: Secondary | ICD-10-CM | POA: Diagnosis not present

## 2020-06-10 LAB — BASIC METABOLIC PANEL
Anion gap: 9 (ref 5–15)
BUN: 11 mg/dL (ref 8–23)
CO2: 34 mmol/L — ABNORMAL HIGH (ref 22–32)
Calcium: 8.5 mg/dL — ABNORMAL LOW (ref 8.9–10.3)
Chloride: 98 mmol/L (ref 98–111)
Creatinine, Ser: 0.43 mg/dL — ABNORMAL LOW (ref 0.44–1.00)
GFR, Estimated: >60 mL/min (ref >60)
Glucose, Bld: 108 mg/dL — ABNORMAL HIGH (ref 70–99)
Potassium: 4.2 mmol/L (ref 3.5–5.1)
Sodium: 141 mmol/L (ref 135–145)

## 2020-06-10 LAB — MAGNESIUM: Magnesium: 1.9 mg/dL (ref 1.7–2.4)

## 2020-06-10 NOTE — Progress Notes (Signed)
Pulmonary Critical Care Medicine [facility] [place]   PULMONARY CRITICAL CARE SERVICE  PROGRESS NOTE  Date of Service: 06/10/2020  Andrea Fuller  YQM:[id number]  DOB: [birth year]   DOA: 05/21/2020  Referring Physician: Carron Curie, MD  HPI: Andrea Fuller is a 65 y.o. female seen for follow up of Acute on Chronic Respiratory Failure.  Patient is on T collar has been on 40% FiO2 with a goal today of 12 hours  Medications: Reviewed on Rounds  Physical Exam:  Vitals: Temperature is 97.0 pulse 73 respiratory rate 12 blood pressure is 142/64 saturations 92%  Ventilator Settings on T collar FiO2 40%  . General: Comfortable at this time . Eyes: Grossly normal lids, irises & conjunctiva . ENT: grossly tongue is normal . Neck: no obvious mass . Cardiovascular: S1 S2 normal no gallop . Respiratory: Scattered rhonchi expansion is equal . Abdomen: soft . Skin: no rash seen on limited exam . Musculoskeletal: not rigid . Psychiatric:unable to assess . Neurologic: no seizure no involuntary movements         Lab Data:   Basic Metabolic Panel: Recent Labs  Lab 06/05/20 0511 06/06/20 0549 06/07/20 1049 06/08/20 0630 06/10/20 0415  NA 142 140  --  141 141  K 4.0 3.8  --  4.1 4.2  CL 100 95*  --  97* 98  CO2 31 37*  --  33* 34*  GLUCOSE 108* 106*  --  96 108*  BUN 24* 18  --  14 11  CREATININE 0.49 0.42*  --  0.51 0.43*  CALCIUM 8.6* 8.5*  --  8.5* 8.5*  MG 1.8 1.7 1.8 1.7 1.9  PHOS 3.8 3.5  --  3.7  --     ABG: No results for input(s): PHART, PCO2ART, PO2ART, HCO3, O2SAT in the last 168 hours.  Liver Function Tests: No results for input(s): AST, ALT, ALKPHOS, BILITOT, PROT, ALBUMIN in the last 168 hours. No results for input(s): LIPASE, AMYLASE in the last 168 hours. No results for input(s): AMMONIA in the last 168 hours.  CBC: Recent Labs  Lab 06/05/20 0511 06/08/20 0630  WBC 9.6 8.6  HGB 9.1* 9.1*  HCT 31.6* 29.7*  MCV 93.5 90.3  PLT 242 261     Cardiac Enzymes: No results for input(s): CKTOTAL, CKMB, CKMBINDEX, TROPONINI in the last 168 hours.  BNP (last 3 results) No results for input(s): BNP in the last 8760 hours.  ProBNP (last 3 results) No results for input(s): PROBNP in the last 8760 hours.  Radiological Exams: No results found.  Assessment/Plan Active Problems:   Acute on chronic respiratory failure with hypoxia (HCC)   COVID-19 virus infection   Pneumonia due to COVID-19 virus   Acute respiratory distress syndrome (ARDS) due to COVID-19 virus (HCC)   1. Acute on chronic respiratory failure hypoxia we will continue with T collar trials titrate oxygen continue pulmonary toilet. 2. COVID-19 virus infection recovery we will continue to follow 3. Pneumonia due to COVID-19 treated we will continue with present management 4. ARDS resolution so slowed to improve we will continue to monitor   I have personally seen and evaluated the patient, evaluated laboratory and imaging results, formulated the assessment and plan and placed orders. The Patient requires high complexity decision making with multiple systems involvement.  Rounds were done with the Respiratory Therapy Director and Staff therapists and discussed with nursing staff also.  Yevonne Pax, MD [facility] Pulmonary Critical Care Medicine Sleep Medicine

## 2020-06-11 DIAGNOSIS — J9621 Acute and chronic respiratory failure with hypoxia: Secondary | ICD-10-CM | POA: Diagnosis not present

## 2020-06-11 DIAGNOSIS — Z9911 Dependence on respirator [ventilator] status: Secondary | ICD-10-CM | POA: Diagnosis not present

## 2020-06-11 DIAGNOSIS — J80 Acute respiratory distress syndrome: Secondary | ICD-10-CM | POA: Diagnosis not present

## 2020-06-11 DIAGNOSIS — U071 COVID-19: Secondary | ICD-10-CM | POA: Diagnosis not present

## 2020-06-11 LAB — MAGNESIUM: Magnesium: 1.9 mg/dL (ref 1.7–2.4)

## 2020-06-11 LAB — PHOSPHORUS: Phosphorus: 3.9 mg/dL (ref 2.5–4.6)

## 2020-06-11 NOTE — Progress Notes (Signed)
Pulmonary Critical Care Medicine [facility] [place]   PULMONARY CRITICAL CARE SERVICE  PROGRESS NOTE  Date of Service: 06/11/2020  Andrea Fuller  [id number]  DOB: 01/22/1956   DOA: 05/21/2020  Referring Physician: Carron Curie, MD  HPI: Andrea Fuller is a 65 y.o. female seen for follow up of Acute on Chronic Respiratory Failure.  She is doing fairly well has been off of the ventilator on T collar the goal is for 20 hours off the vent  Medications: Reviewed on Rounds  Physical Exam:  Vitals: Temperature 97.2 pulse 67 respiratory 24 blood pressure 132/57 saturations 99%  Ventilator Settings on T collar  . General: Comfortable at this time . Eyes: Grossly normal lids, irises & conjunctiva . ENT: grossly tongue is normal . Neck: no obvious mass . Cardiovascular: S1 S2 normal no gallop . Respiratory: Scattered rhonchi expansion is equal . Abdomen: soft . Skin: no rash seen on limited exam . Musculoskeletal: not rigid . Psychiatric:unable to assess . Neurologic: no seizure no involuntary movements         Lab Data:   Basic Metabolic Panel: Recent Labs  Lab 06/05/20 0511 06/06/20 0549 06/07/20 1049 06/08/20 0630 06/10/20 0415 06/11/20 0432  NA 142 140  --  141 141  --   K 4.0 3.8  --  4.1 4.2  --   CL 100 95*  --  97* 98  --   CO2 31 37*  --  33* 34*  --   GLUCOSE 108* 106*  --  96 108*  --   BUN 24* 18  --  14 11  --   CREATININE 0.49 0.42*  --  0.51 0.43*  --   CALCIUM 8.6* 8.5*  --  8.5* 8.5*  --   MG 1.8 1.7 1.8 1.7 1.9 1.9  PHOS 3.8 3.5  --  3.7  --  3.9    ABG: No results for input(s): PHART, PCO2ART, PO2ART, HCO3, O2SAT in the last 168 hours.  Liver Function Tests: No results for input(s): AST, ALT, ALKPHOS, BILITOT, PROT, ALBUMIN in the last 168 hours. No results for input(s): LIPASE, AMYLASE in the last 168 hours. No results for input(s): AMMONIA in the last 168 hours.  CBC: Recent Labs  Lab 06/05/20 0511 06/08/20 0630  WBC 9.6 8.6   HGB 9.1* 9.1*  HCT 31.6* 29.7*  MCV 93.5 90.3  PLT 242 261    Cardiac Enzymes: No results for input(s): CKTOTAL, CKMB, CKMBINDEX, TROPONINI in the last 168 hours.  BNP (last 3 results) No results for input(s): BNP in the last 8760 hours.  ProBNP (last 3 results) No results for input(s): PROBNP in the last 8760 hours.  Radiological Exams: No results found.  Assessment/Plan Active Problems:   Acute on chronic respiratory failure with hypoxia (HCC)   COVID-19 virus infection   Pneumonia due to COVID-19 virus   Acute respiratory distress syndrome (ARDS) due to COVID-19 virus (HCC)   1. Acute on chronic respiratory failure with hypoxia we will continue with T-piece patient's goal is 20 hours. 2. COVID-19 virus infection recovery phase we will continue to follow. 3. Pneumonia due to COVID-19 treated slowly improving 4. ARDS again improving slowly we will continue to monitor   I have personally seen and evaluated the patient, evaluated laboratory and imaging results, formulated the assessment and plan and placed orders. The Patient requires high complexity decision making with multiple systems involvement.  Rounds were done with the Respiratory Therapy Director and Staff therapists and  discussed with nursing staff also.  Allyne Gee, MD [facility] Pulmonary Critical Care Medicine Sleep Medicine

## 2020-06-12 DIAGNOSIS — J80 Acute respiratory distress syndrome: Secondary | ICD-10-CM | POA: Diagnosis not present

## 2020-06-12 DIAGNOSIS — U071 COVID-19: Secondary | ICD-10-CM | POA: Diagnosis not present

## 2020-06-12 DIAGNOSIS — Z9911 Dependence on respirator [ventilator] status: Secondary | ICD-10-CM | POA: Diagnosis not present

## 2020-06-12 DIAGNOSIS — J9621 Acute and chronic respiratory failure with hypoxia: Secondary | ICD-10-CM | POA: Diagnosis not present

## 2020-06-12 LAB — BASIC METABOLIC PANEL
Anion gap: 10 (ref 5–15)
BUN: 17 mg/dL (ref 8–23)
CO2: 32 mmol/L (ref 22–32)
Calcium: 8.3 mg/dL — ABNORMAL LOW (ref 8.9–10.3)
Chloride: 97 mmol/L — ABNORMAL LOW (ref 98–111)
Creatinine, Ser: 0.41 mg/dL — ABNORMAL LOW (ref 0.44–1.00)
GFR, Estimated: >60 mL/min (ref >60)
Glucose, Bld: 131 mg/dL — ABNORMAL HIGH (ref 70–99)
Potassium: 4.3 mmol/L (ref 3.5–5.1)
Sodium: 139 mmol/L (ref 135–145)

## 2020-06-12 LAB — TRIGLYCERIDES: Triglycerides: 131 mg/dL (ref <150)

## 2020-06-12 LAB — CBC
HCT: 32.7 % — ABNORMAL LOW (ref 36.0–46.0)
Hemoglobin: 9.6 g/dL — ABNORMAL LOW (ref 12.0–15.0)
MCH: 27.1 pg (ref 26.0–34.0)
MCHC: 29.4 g/dL — ABNORMAL LOW (ref 30.0–36.0)
MCV: 92.4 fL (ref 80.0–100.0)
Platelets: 311 10*3/uL (ref 150–400)
RBC: 3.54 MIL/uL — ABNORMAL LOW (ref 3.87–5.11)
RDW: 18.2 % — ABNORMAL HIGH (ref 11.5–15.5)
WBC: 7.2 10*3/uL (ref 4.0–10.5)
nRBC: 0 % (ref 0.0–0.2)

## 2020-06-12 LAB — MAGNESIUM: Magnesium: 1.8 mg/dL (ref 1.7–2.4)

## 2020-06-12 NOTE — Progress Notes (Signed)
Pulmonary Critical Care Medicine [facility] [place]   PULMONARY CRITICAL CARE SERVICE  PROGRESS NOTE  Date of Service: 06/12/2020  Andrea Fuller  [id number]:[id number]  DOB: 12-Oct-1955   DOA: 05/21/2020  Referring Physician: Carron Curie, MD  HPI: Andrea Fuller is a 65 y.o. female seen for follow up of Acute on Chronic Respiratory Failure.  Patient at this time is on T collar has been on 40% FiO2 goal today is for 24-hour  Medications: Reviewed on Rounds  Physical Exam:  Vitals: Temperature 98.0 pulse 69 respiratory rate is 21 blood pressure is 125/60 saturations 98%  Ventilator Settings off the ventilator on T collar  . General: Comfortable at this time . Eyes: Grossly normal lids, irises & conjunctiva . ENT: grossly tongue is normal . Neck: no obvious mass . Cardiovascular: S1 S2 normal no gallop . Respiratory: No rhonchi very coarse breath sounds . Abdomen: soft . Skin: no rash seen on limited exam . Musculoskeletal: not rigid . Psychiatric:unable to assess . Neurologic: no seizure no involuntary movements         Lab Data:   Basic Metabolic Panel: Recent Labs  Lab 06/06/20 0549 06/07/20 1049 06/08/20 0630 06/10/20 0415 06/11/20 0432 06/12/20 0205  NA 140  --  141 141  --  139  K 3.8  --  4.1 4.2  --  4.3  CL 95*  --  97* 98  --  97*  CO2 37*  --  33* 34*  --  32  GLUCOSE 106*  --  96 108*  --  131*  BUN 18  --  14 11  --  17  CREATININE 0.42*  --  0.51 0.43*  --  0.41*  CALCIUM 8.5*  --  8.5* 8.5*  --  8.3*  MG 1.7 1.8 1.7 1.9 1.9 1.8  PHOS 3.5  --  3.7  --  3.9  --     ABG: No results for input(s): PHART, PCO2ART, PO2ART, HCO3, O2SAT in the last 168 hours.  Liver Function Tests: No results for input(s): AST, ALT, ALKPHOS, BILITOT, PROT, ALBUMIN in the last 168 hours. No results for input(s): LIPASE, AMYLASE in the last 168 hours. No results for input(s): AMMONIA in the last 168 hours.  CBC: Recent Labs  Lab 06/08/20 0630 06/12/20 0205  WBC  8.6 7.2  HGB 9.1* 9.6*  HCT 29.7* 32.7*  MCV 90.3 92.4  PLT 261 311    Cardiac Enzymes: No results for input(s): CKTOTAL, CKMB, CKMBINDEX, TROPONINI in the last 168 hours.  BNP (last 3 results) No results for input(s): BNP in the last 8760 hours.  ProBNP (last 3 results) No results for input(s): PROBNP in the last 8760 hours.  Radiological Exams: No results found.  Assessment/Plan Active Problems:   Acute on chronic respiratory failure with hypoxia (HCC)   COVID-19 virus infection   Pneumonia due to COVID-19 virus   Acute respiratory distress syndrome (ARDS) due to COVID-19 virus (HCC)   1. Acute on chronic respiratory failure hypoxia we will continue with T collar titrate oxygen continue pulmonary toilet. 2. COVID-19 virus infection recovery phase 3. Pneumonia due to COVID-19 treated slow improvement 4. ARDS treated slow improvement we will continue to monitor   I have personally seen and evaluated the patient, evaluated laboratory and imaging results, formulated the assessment and plan and placed orders. The Patient requires high complexity decision making with multiple systems involvement.  Rounds were done with the Respiratory Therapy Director and Staff therapists and discussed  with nursing staff also.  Allyne Gee, MD [facility] Pulmonary Critical Care Medicine Sleep Medicine

## 2020-06-12 NOTE — Progress Notes (Signed)
PROGRESS NOTE    Andrea Fuller  KGM:[id number] DOB: 11-Apr-1956 DOA: 05/21/2020   Brief Narrative:  Andrea Fuller is an 65 y.o. female with medical history significant of morbid obesity, hypertension, hyperlipidemia, osteoarthritis with history of cortisone injection to both knees who was admitted to the acute facility with worsening shortness of breath, respiratory failure.  She was diagnosed to have COVID-19 infection.  She was given treatment with remdesivir, steroids.  She also had acute kidney injury and was given IV fluids.  She subsequently developed ARDS eventually requiring intubation ventilation, tracheostomy and PEG tube placement.  She was also found to have bilateral lower extremity DVTs, started on anticoagulation. Per records from the outside facility she had some drainage around the PEG tube site.  She was started on antibiotics however, cultures were all negative therefore antibiotics were discontinued.  Due to her complex medical problems she was transferred and admitted to [facility] on 05/21/2020.  She was noted to have induration around the PEG tube site.  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  She was also found to have bibasilar pneumonia with pleural effusions.  PEG tube was removed.  However, she had severe induration around the PEG tube site.  Therefore she was started on IV vancomycin, Ancef, Flagyl. -The induration and erythema around the previous PEG tube site has improved remarkably.  She has 1 small area with some mild drainage with DuoDERM on it.  Otherwise seems to be stable.   Assessment & Plan: Acute on chronic respiratory failure with hypoxia Abdominal  wall cellulitis COVID-19 virus infection Pneumonia due to COVID-19 virus UTI with E. Coli   Acute respiratory distress syndrome (ARDS) due to COVID-19 virus (HCC) Systemic inflammatory response syndrome Bilateral lower extremity DVT History of depression Morbid obesity Anemia Dysphagia  Acute on chronic respiratory failure with hypoxemia: Patient initially had COVID-19 infection resulting in respiratory failure.  Currently has trach in place, vent dependent.  Unfortunately she also developed ARDS.  She also had probable secondary bacterial pneumonia for which she received antibiotics at the outside facility.  However, cultures were negative therefore antibiotics were discontinued.  Here she had fever, systemic inflammatory response syndrome.  Respiratory cultures showed staph epidermidis.  Previously was on treatment with IV vancomycin, cefepime, Flagyl.  Antibiotics were switched to IV vancomycin, Ancef, Flagyl for the abdominal wall cellulitis and induration at the site of the previous PEG tube.  -She is status post 2 weeks of treatment with antibiotics.  Completing treatment with Flagyl. Respiratory status appears to be stable.  Continue to monitor closely.  Pulmonary following.  Abdominal wall cellulitis: Patient had severe cellulitis with induration likely secondary to displacement of the PEG tube.  CT findings as mentioned above. PEG tube has been removed.  She received treatment with IV vancomycin, Ancef, Flagyl with significant improvement. She has 1 small area with some scant drainage.  Continue to monitor closely.  If she continues to have persistent drainage or any worsening then consider imaging her abdomen and restarting her on the IV antibiotics.  COVID-19 infection: Patient was treated with remdesivir, steroids at the acute facility.  Continue supportive management per primary team.  Pneumonia: Respiratory cultures here showed staph.  She completed treatment  with IV vancomycin.  She unfortunately also likely has ARDS from the COVID-19 infection.  Respite status appears to be stable at this time.  However, she has dysphagia and high risk for aspiration and recurrent aspiration pneumonia.  - If her respiratory status worsens suggest to repeat chest imaging preferably chest CT to better evaluate and send for repeat respiratory cultures and start empiric antibiotics.  UTI: Urine culture showed E. coli.  Susceptible to Ancef.  Treated with antibiotics as mentioned above.  Denies having any urinary symptoms at this time.  History of depression: Patient apparently was on multiple medications for her depression. Monitor closely.  Further management per primary team.  Bilateral lower extremity DVT: On anticoagulation per the primary team.  Dysphagia: Unfortunately due to her dysphagia she is high risk for aspiration and recurrent aspiration pneumonia.  Due to her complex medical problems she is very high risk for worsening and decompensation.  Plan of care discussed with patient and her daughter at the bedside.  Also discussed with the primary team.   Subjective: The induration around her previous PEG tube site has improved quite significantly.  Respiratory status appears to be stable at this time.  She denies having any major complaints.  Objective: Vitals: Temperature 98.4, pulse 69, respiratory rate 22, blood pressure 134/60, pulse oximetry 98%   Examination: Constitutional:  Awake, not in any acute distress at this time. Head: Atraumatic, normocephalic Eyes: PERLA ENMT: external ears and nose appear normal,Lips appears normal, has DHT, moist oral mucosa  Neck: Has trach in place CVS: S1-S2 Respiratory:  Occasional rhonchi, no wheezing Abdomen: Obese, prior PEG tube site with significant improvement in the erythema and induration, small amount of drainage with DuoDERM in place, non tender, positive bowel sounds Musculoskeletal: Edema Neuro:  Debility with  generalized weakness Psych:  Stable Skin:  No new rashes     Data Reviewed: I have personally reviewed following labs and imaging studies  CBC: Recent Labs  Lab 06/08/20 0630 06/12/20 0205  WBC 8.6 7.2  HGB 9.1* 9.6*  HCT 29.7* 32.7*  MCV 90.3 92.4  PLT 261 311    Basic Metabolic Panel: Recent Labs  Lab 06/06/20 0549 06/07/20 1049 06/08/20 0630 06/10/20 0415 06/11/20 0432 06/12/20 0205  NA 140  --  141 141  --  139  K 3.8  --  4.1 4.2  --  4.3  CL 95*  --  97* 98  --  97*  CO2 37*  --  33* 34*  --  32  GLUCOSE 106*  --  96 108*  --  131*  BUN 18  --  14 11  --  17  CREATININE 0.42*  --  0.51 0.43*  --  0.41*  CALCIUM 8.5*  --  8.5* 8.5*  --  8.3*  MG 1.7 1.8 1.7 1.9 1.9 1.8  PHOS 3.5  --  3.7  --  3.9  --     GFR: CrCl cannot be calculated (Unknown ideal weight.).  Liver Function Tests: No results for input(s): AST, ALT, ALKPHOS, BILITOT, PROT, ALBUMIN in the last 168 hours.  CBG: No results for input(s): GLUCAP in the last 168 hours.   No results found for this or any previous visit (from the past 240 hour(s)).    Scheduled Meds: Please see MAR    Vonzella Nipple, MD 06/12/2020, 4:48 PM

## 2020-06-13 DIAGNOSIS — J80 Acute respiratory distress syndrome: Secondary | ICD-10-CM | POA: Diagnosis not present

## 2020-06-13 DIAGNOSIS — Z9911 Dependence on respirator [ventilator] status: Secondary | ICD-10-CM | POA: Diagnosis not present

## 2020-06-13 DIAGNOSIS — J9621 Acute and chronic respiratory failure with hypoxia: Secondary | ICD-10-CM | POA: Diagnosis not present

## 2020-06-13 DIAGNOSIS — U071 COVID-19: Secondary | ICD-10-CM | POA: Diagnosis not present

## 2020-06-13 LAB — BLOOD GAS, ARTERIAL
Acid-Base Excess: 9.4 mmol/L — ABNORMAL HIGH (ref 0.0–2.0)
Bicarbonate: 34.2 mmol/L — ABNORMAL HIGH (ref 20.0–28.0)
FIO2: 40
O2 Saturation: 95.8 %
Patient temperature: 36.5
pCO2 arterial: 52.3 mmHg — ABNORMAL HIGH (ref 32.0–48.0)
pH, Arterial: 7.428 (ref 7.350–7.450)
pO2, Arterial: 75.7 mmHg — ABNORMAL LOW (ref 83.0–108.0)

## 2020-06-13 NOTE — Progress Notes (Signed)
Pulmonary Critical Care Medicine [facility] [place]   PULMONARY CRITICAL CARE SERVICE  PROGRESS NOTE  Date of Service: 06/13/2020  Andrea Fuller  WCB:[id number]  DOB: 07/23/1955   DOA: 05/21/2020  Referring Physician: Carron Curie, MD  HPI: Andrea Fuller is a 65 y.o. female seen for follow up of Acute on Chronic Respiratory Failure.  Patient is on T collar currently on 40% FiO2 the goal today is for 48 hours  Medications: Reviewed on Rounds  Physical Exam:  Vitals: Temperature is 96.6 pulse 84 respiratory 22 blood pressure is 111/55 saturations 100%  Ventilator Settings on T collar FiO2 40%  . General: Comfortable at this time . Eyes: Grossly normal lids, irises & conjunctiva . ENT: grossly tongue is normal . Neck: no obvious mass . Cardiovascular: S1 S2 normal no gallop . Respiratory: No rhonchi very coarse breath sounds . Abdomen: soft . Skin: no rash seen on limited exam . Musculoskeletal: not rigid . Psychiatric:unable to assess . Neurologic: no seizure no involuntary movements         Lab Data:   Basic Metabolic Panel: Recent Labs  Lab 06/07/20 1049 06/08/20 0630 06/10/20 0415 06/11/20 0432 06/12/20 0205  NA  --  141 141  --  139  K  --  4.1 4.2  --  4.3  CL  --  97* 98  --  97*  CO2  --  33* 34*  --  32  GLUCOSE  --  96 108*  --  131*  BUN  --  14 11  --  17  CREATININE  --  0.51 0.43*  --  0.41*  CALCIUM  --  8.5* 8.5*  --  8.3*  MG 1.8 1.7 1.9 1.9 1.8  PHOS  --  3.7  --  3.9  --     ABG: Recent Labs  Lab 06/13/20 0814  PHART 7.428  PCO2ART 52.3*  PO2ART 75.7*  HCO3 34.2*  O2SAT 95.8    Liver Function Tests: No results for input(s): AST, ALT, ALKPHOS, BILITOT, PROT, ALBUMIN in the last 168 hours. No results for input(s): LIPASE, AMYLASE in the last 168 hours. No results for input(s): AMMONIA in the last 168 hours.  CBC: Recent Labs  Lab 06/08/20 0630 06/12/20 0205  WBC 8.6 7.2  HGB 9.1* 9.6*  HCT 29.7* 32.7*  MCV 90.3  92.4  PLT 261 311    Cardiac Enzymes: No results for input(s): CKTOTAL, CKMB, CKMBINDEX, TROPONINI in the last 168 hours.  BNP (last 3 results) No results for input(s): BNP in the last 8760 hours.  ProBNP (last 3 results) No results for input(s): PROBNP in the last 8760 hours.  Radiological Exams: No results found.  Assessment/Plan Active Problems:   Acute on chronic respiratory failure with hypoxia (HCC)   COVID-19 virus infection   Pneumonia due to COVID-19 virus   Acute respiratory distress syndrome (ARDS) due to COVID-19 virus (HCC)   1. Acute on chronic respiratory failure hypoxia we will continue to wean the goals 48 hours on T collar 2. COVID-19 virus infection recovery we will continue to follow 3. Pneumonia due to COVID-19 treated we will continue with supportive care 4. ARDS treated slowly improving   I have personally seen and evaluated the patient, evaluated laboratory and imaging results, formulated the assessment and plan and placed orders. The Patient requires high complexity decision making with multiple systems involvement.  Rounds were done with the Respiratory Therapy Director and Staff therapists and discussed with nursing staff  also.  Allyne Gee, MD [facility] Pulmonary Critical Care Medicine Sleep Medicine

## 2020-06-14 LAB — PHOSPHORUS: Phosphorus: 4.1 mg/dL (ref 2.5–4.6)

## 2020-06-14 LAB — MAGNESIUM: Magnesium: 1.8 mg/dL (ref 1.7–2.4)

## 2020-06-15 DIAGNOSIS — U071 COVID-19: Secondary | ICD-10-CM | POA: Diagnosis not present

## 2020-06-15 DIAGNOSIS — Z9911 Dependence on respirator [ventilator] status: Secondary | ICD-10-CM | POA: Diagnosis not present

## 2020-06-15 DIAGNOSIS — J80 Acute respiratory distress syndrome: Secondary | ICD-10-CM | POA: Diagnosis not present

## 2020-06-15 DIAGNOSIS — J9621 Acute and chronic respiratory failure with hypoxia: Secondary | ICD-10-CM | POA: Diagnosis not present

## 2020-06-15 NOTE — Progress Notes (Signed)
Pulmonary Critical Care Medicine [facility] [place]   PULMONARY CRITICAL CARE SERVICE  PROGRESS NOTE  Date of Service: 06/15/2020  Andrea Fuller  DEY:[id number]  DOB: Feb 16, 1956   DOA: 05/21/2020  Referring Physician: Carron Curie, MD  HPI: Andrea Fuller is a 65 y.o. female seen for follow up of Acute on Chronic Respiratory Failure.  Patient is on T collar currently on 35% FiO2 supposed to be doing it with the PMV  Medications: Reviewed on Rounds  Physical Exam:  Vitals: Temperature 98.1 pulse 64 respiratory rate 30 blood pressure is 151/78 saturations 95%  Ventilator Settings on T collar FiO2 of 35% with PMV  . General: Comfortable at this time . Eyes: Grossly normal lids, irises & conjunctiva . ENT: grossly tongue is normal . Neck: no obvious mass . Cardiovascular: S1 S2 normal no gallop . Respiratory: Scattered rhonchi expansion is equal . Abdomen: soft . Skin: no rash seen on limited exam . Musculoskeletal: not rigid . Psychiatric:unable to assess . Neurologic: no seizure no involuntary movements         Lab Data:   Basic Metabolic Panel: Recent Labs  Lab 06/10/20 0415 06/11/20 0432 06/12/20 0205 06/14/20 0453  NA 141  --  139  --   K 4.2  --  4.3  --   CL 98  --  97*  --   CO2 34*  --  32  --   GLUCOSE 108*  --  131*  --   BUN 11  --  17  --   CREATININE 0.43*  --  0.41*  --   CALCIUM 8.5*  --  8.3*  --   MG 1.9 1.9 1.8 1.8  PHOS  --  3.9  --  4.1    ABG: Recent Labs  Lab 06/13/20 0814  PHART 7.428  PCO2ART 52.3*  PO2ART 75.7*  HCO3 34.2*  O2SAT 95.8    Liver Function Tests: No results for input(s): AST, ALT, ALKPHOS, BILITOT, PROT, ALBUMIN in the last 168 hours. No results for input(s): LIPASE, AMYLASE in the last 168 hours. No results for input(s): AMMONIA in the last 168 hours.  CBC: Recent Labs  Lab 06/12/20 0205  WBC 7.2  HGB 9.6*  HCT 32.7*  MCV 92.4  PLT 311    Cardiac Enzymes: No results for input(s): CKTOTAL,  CKMB, CKMBINDEX, TROPONINI in the last 168 hours.  BNP (last 3 results) No results for input(s): BNP in the last 8760 hours.  ProBNP (last 3 results) No results for input(s): PROBNP in the last 8760 hours.  Radiological Exams: No results found.  Assessment/Plan Active Problems:   Acute on chronic respiratory failure with hypoxia (HCC)   COVID-19 virus infection   Pneumonia due to COVID-19 virus   Acute respiratory distress syndrome (ARDS) due to COVID-19 virus (HCC)   1. Acute on chronic respiratory failure with hypoxia we will continue T collar trials and use the PMV as ordered 2. COVID-19 virus infection recovery we will continue to follow 3. Pneumonia due to COVID-19 treated 4. ARDS treated continue with supportive care   I have personally seen and evaluated the patient, evaluated laboratory and imaging results, formulated the assessment and plan and placed orders. The Patient requires high complexity decision making with multiple systems involvement.  Rounds were done with the Respiratory Therapy Director and Staff therapists and discussed with nursing staff also.  Yevonne Pax, MD [facility] Pulmonary Critical Care Medicine Sleep Medicine

## 2020-06-16 DIAGNOSIS — Z9911 Dependence on respirator [ventilator] status: Secondary | ICD-10-CM | POA: Diagnosis not present

## 2020-06-16 DIAGNOSIS — U071 COVID-19: Secondary | ICD-10-CM | POA: Diagnosis not present

## 2020-06-16 DIAGNOSIS — J9621 Acute and chronic respiratory failure with hypoxia: Secondary | ICD-10-CM | POA: Diagnosis not present

## 2020-06-16 DIAGNOSIS — J80 Acute respiratory distress syndrome: Secondary | ICD-10-CM | POA: Diagnosis not present

## 2020-06-16 LAB — BASIC METABOLIC PANEL
Anion gap: 9 (ref 5–15)
BUN: 13 mg/dL (ref 8–23)
CO2: 33 mmol/L — ABNORMAL HIGH (ref 22–32)
Calcium: 8.9 mg/dL (ref 8.9–10.3)
Chloride: 99 mmol/L (ref 98–111)
Creatinine, Ser: 0.47 mg/dL (ref 0.44–1.00)
GFR, Estimated: >60 mL/min (ref >60)
Glucose, Bld: 104 mg/dL — ABNORMAL HIGH (ref 70–99)
Potassium: 3.8 mmol/L (ref 3.5–5.1)
Sodium: 141 mmol/L (ref 135–145)

## 2020-06-16 LAB — MAGNESIUM: Magnesium: 1.8 mg/dL (ref 1.7–2.4)

## 2020-06-16 LAB — CBC
HCT: 35.4 % — ABNORMAL LOW (ref 36.0–46.0)
Hemoglobin: 11.1 g/dL — ABNORMAL LOW (ref 12.0–15.0)
MCH: 27.9 pg (ref 26.0–34.0)
MCHC: 31.4 g/dL (ref 30.0–36.0)
MCV: 88.9 fL (ref 80.0–100.0)
Platelets: 286 10*3/uL (ref 150–400)
RBC: 3.98 MIL/uL (ref 3.87–5.11)
RDW: 18.9 % — ABNORMAL HIGH (ref 11.5–15.5)
WBC: 9.9 10*3/uL (ref 4.0–10.5)
nRBC: 0 % (ref 0.0–0.2)

## 2020-06-16 NOTE — Progress Notes (Signed)
Pulmonary Critical Care Medicine [facility] [place]   PULMONARY CRITICAL CARE SERVICE  PROGRESS NOTE  Date of Service: 06/16/2020  Andrea Fuller  IRW:[id number]  DOB: 11/26/1955   DOA: 05/21/2020  Referring Physician: Carron Curie, MD  HPI: Andrea Fuller is a 65 y.o. female seen for follow up of Acute on Chronic Respiratory Failure.  Off the ventilator on T collar currently on 35% FiO2  Medications: Reviewed on Rounds  Physical Exam:  Vitals: Temperature is 97.8 pulse 60 respiratory rate is 20 blood pressure is 150/87 saturations 98%  Ventilator Settings off the vent on T collar FiO2 35%  . General: Comfortable at this time . Eyes: Grossly normal lids, irises & conjunctiva . ENT: grossly tongue is normal . Neck: no obvious mass . Cardiovascular: S1 S2 normal no gallop . Respiratory: No rhonchi no rales noted at this time . Abdomen: soft . Skin: no rash seen on limited exam . Musculoskeletal: not rigid . Psychiatric:unable to assess . Neurologic: no seizure no involuntary movements         Lab Data:   Basic Metabolic Panel: Recent Labs  Lab 06/10/20 0415 06/11/20 0432 06/12/20 0205 06/14/20 0453 06/16/20 0422  NA 141  --  139  --  141  K 4.2  --  4.3  --  3.8  CL 98  --  97*  --  99  CO2 34*  --  32  --  33*  GLUCOSE 108*  --  131*  --  104*  BUN 11  --  17  --  13  CREATININE 0.43*  --  0.41*  --  0.47  CALCIUM 8.5*  --  8.3*  --  8.9  MG 1.9 1.9 1.8 1.8 1.8  PHOS  --  3.9  --  4.1  --     ABG: Recent Labs  Lab 06/13/20 0814  PHART 7.428  PCO2ART 52.3*  PO2ART 75.7*  HCO3 34.2*  O2SAT 95.8    Liver Function Tests: No results for input(s): AST, ALT, ALKPHOS, BILITOT, PROT, ALBUMIN in the last 168 hours. No results for input(s): LIPASE, AMYLASE in the last 168 hours. No results for input(s): AMMONIA in the last 168 hours.  CBC: Recent Labs  Lab 06/12/20 0205 06/16/20 0422  WBC 7.2 9.9  HGB 9.6* 11.1*  HCT 32.7* 35.4*  MCV 92.4  88.9  PLT 311 286    Cardiac Enzymes: No results for input(s): CKTOTAL, CKMB, CKMBINDEX, TROPONINI in the last 168 hours.  BNP (last 3 results) No results for input(s): BNP in the last 8760 hours.  ProBNP (last 3 results) No results for input(s): PROBNP in the last 8760 hours.  Radiological Exams: No results found.  Assessment/Plan Active Problems:   Acute on chronic respiratory failure with hypoxia (HCC)   COVID-19 virus infection   Pneumonia due to COVID-19 virus   Acute respiratory distress syndrome (ARDS) due to COVID-19 virus (HCC)   1. Acute on chronic respiratory failure with hypoxia we will continue with T-piece titrate oxygen continue pulmonary toilet. 2. COVID-19 virus infection recovery continue supportive care 3. Pneumonia due to COVID-19 treated slow improvement 4. ARDS treated we will continue to follow   I have personally seen and evaluated the patient, evaluated laboratory and imaging results, formulated the assessment and plan and placed orders. The Patient requires high complexity decision making with multiple systems involvement.  Rounds were done with the Respiratory Therapy Director and Staff therapists and discussed with nursing staff also.  Tillie Viverette  Richardson Dopp, MD [facility] Pulmonary Critical Care Medicine Sleep Medicine

## 2020-06-17 ENCOUNTER — Other Ambulatory Visit (HOSPITAL_COMMUNITY): Payer: Medicare HMO

## 2020-06-17 DIAGNOSIS — J80 Acute respiratory distress syndrome: Secondary | ICD-10-CM | POA: Diagnosis not present

## 2020-06-17 DIAGNOSIS — U071 COVID-19: Secondary | ICD-10-CM | POA: Diagnosis not present

## 2020-06-17 DIAGNOSIS — Z9911 Dependence on respirator [ventilator] status: Secondary | ICD-10-CM | POA: Diagnosis not present

## 2020-06-17 DIAGNOSIS — J9621 Acute and chronic respiratory failure with hypoxia: Secondary | ICD-10-CM | POA: Diagnosis not present

## 2020-06-17 LAB — PHOSPHORUS: Phosphorus: 4.3 mg/dL (ref 2.5–4.6)

## 2020-06-17 LAB — MAGNESIUM: Magnesium: 1.8 mg/dL (ref 1.7–2.4)

## 2020-06-17 NOTE — Progress Notes (Signed)
Pulmonary Critical Care Medicine [facility] [place]   PULMONARY CRITICAL CARE SERVICE  PROGRESS NOTE  Date of Service: 06/17/2020  Andrea Fuller  WNU:[id number]  DOB: Jan 28, 1956   DOA: 05/21/2020  Referring Physician: Carron Curie, MD  HPI: Andrea Fuller is a 65 y.o. female seen for follow up of Acute on Chronic Respiratory Failure.  Is on T collar currently on 28% with the PMV  Medications: Reviewed on Rounds  Physical Exam:  Vitals: Temperature is 98.6 pulse 58 respiratory rate 20 blood pressure is 111/70 saturations 98%  Ventilator Settings on T collar FiO2 28%  . General: Comfortable at this time . Eyes: Grossly normal lids, irises & conjunctiva . ENT: grossly tongue is normal . Neck: no obvious mass . Cardiovascular: S1 S2 normal no gallop . Respiratory: No rhonchi very coarse breath sound . Abdomen: soft . Skin: no rash seen on limited exam . Musculoskeletal: not rigid . Psychiatric:unable to assess . Neurologic: no seizure no involuntary movements         Lab Data:   Basic Metabolic Panel: Recent Labs  Lab 06/11/20 0432 06/12/20 0205 06/14/20 0453 06/16/20 0422 06/17/20 0405  NA  --  139  --  141  --   K  --  4.3  --  3.8  --   CL  --  97*  --  99  --   CO2  --  32  --  33*  --   GLUCOSE  --  131*  --  104*  --   BUN  --  17  --  13  --   CREATININE  --  0.41*  --  0.47  --   CALCIUM  --  8.3*  --  8.9  --   MG 1.9 1.8 1.8 1.8 1.8  PHOS 3.9  --  4.1  --  4.3    ABG: Recent Labs  Lab 06/13/20 0814  PHART 7.428  PCO2ART 52.3*  PO2ART 75.7*  HCO3 34.2*  O2SAT 95.8    Liver Function Tests: No results for input(s): AST, ALT, ALKPHOS, BILITOT, PROT, ALBUMIN in the last 168 hours. No results for input(s): LIPASE, AMYLASE in the last 168 hours. No results for input(s): AMMONIA in the last 168 hours.  CBC: Recent Labs  Lab 06/12/20 0205 06/16/20 0422  WBC 7.2 9.9  HGB 9.6* 11.1*  HCT 32.7* 35.4*  MCV 92.4 88.9  PLT 311 286     Cardiac Enzymes: No results for input(s): CKTOTAL, CKMB, CKMBINDEX, TROPONINI in the last 168 hours.  BNP (last 3 results) No results for input(s): BNP in the last 8760 hours.  ProBNP (last 3 results) No results for input(s): PROBNP in the last 8760 hours.  Radiological Exams: No results found.  Assessment/Plan Active Problems:   Acute on chronic respiratory failure with hypoxia (HCC)   COVID-19 virus infection   Pneumonia due to COVID-19 virus   Acute respiratory distress syndrome (ARDS) due to COVID-19 virus (HCC)   1. Acute on chronic respiratory failure hypoxia we will continue with T collar titrate oxygen continue pulmonary toilet. 2. COVID-19 virus infection recovery we will continue to follow along. 3. Pneumonia due to COVID-19 treated continue present management 4. ARDS slow improvement   I have personally seen and evaluated the patient, evaluated laboratory and imaging results, formulated the assessment and plan and placed orders. The Patient requires high complexity decision making with multiple systems involvement.  Rounds were done with the Respiratory Therapy Director and Staff therapists  and discussed with nursing staff also.  Allyne Gee, MD [facility] Pulmonary Critical Care Medicine Sleep Medicine

## 2020-06-18 ENCOUNTER — Other Ambulatory Visit (HOSPITAL_COMMUNITY): Payer: Medicare HMO

## 2020-06-18 DIAGNOSIS — Z9911 Dependence on respirator [ventilator] status: Secondary | ICD-10-CM | POA: Diagnosis not present

## 2020-06-18 DIAGNOSIS — U071 COVID-19: Secondary | ICD-10-CM | POA: Diagnosis not present

## 2020-06-18 DIAGNOSIS — J80 Acute respiratory distress syndrome: Secondary | ICD-10-CM | POA: Diagnosis not present

## 2020-06-18 DIAGNOSIS — J9621 Acute and chronic respiratory failure with hypoxia: Secondary | ICD-10-CM | POA: Diagnosis not present

## 2020-06-18 NOTE — Progress Notes (Signed)
Pulmonary Critical Care Medicine [facility] [place]   PULMONARY CRITICAL CARE SERVICE  PROGRESS NOTE  Date of Service: 06/18/2020  Andrea Fuller  IRS:[id number]  DOB: July 12, 1955   DOA: 05/21/2020  Referring Physician: Carron Curie, MD  HPI: Andrea Fuller is a 65 y.o. female seen for follow up of Acute on Chronic Respiratory Failure.  Patient is on T collar using PMV should be considered goal for capping trials  Medications: Reviewed on Rounds  Physical Exam:  Vitals: Temperature is 98.6 pulse 77 respiratory rate 20 blood pressure is 131/70 saturations 98%  Ventilator Settings on T collar right now capping doing fairly well  . General: Comfortable at this time . Eyes: Grossly normal lids, irises & conjunctiva . ENT: grossly tongue is normal . Neck: no obvious mass . Cardiovascular: S1 S2 normal no gallop . Respiratory: Scattered rhonchi expansion is equal at this time. . Abdomen: soft . Skin: no rash seen on limited exam . Musculoskeletal: not rigid . Psychiatric:unable to assess . Neurologic: no seizure no involuntary movements         Lab Data:   Basic Metabolic Panel: Recent Labs  Lab 06/12/20 0205 06/14/20 0453 06/16/20 0422 06/17/20 0405  NA 139  --  141  --   K 4.3  --  3.8  --   CL 97*  --  99  --   CO2 32  --  33*  --   GLUCOSE 131*  --  104*  --   BUN 17  --  13  --   CREATININE 0.41*  --  0.47  --   CALCIUM 8.3*  --  8.9  --   MG 1.8 1.8 1.8 1.8  PHOS  --  4.1  --  4.3    ABG: Recent Labs  Lab 06/13/20 0814  PHART 7.428  PCO2ART 52.3*  PO2ART 75.7*  HCO3 34.2*  O2SAT 95.8    Liver Function Tests: No results for input(s): AST, ALT, ALKPHOS, BILITOT, PROT, ALBUMIN in the last 168 hours. No results for input(s): LIPASE, AMYLASE in the last 168 hours. No results for input(s): AMMONIA in the last 168 hours.  CBC: Recent Labs  Lab 06/12/20 0205 06/16/20 0422  WBC 7.2 9.9  HGB 9.6* 11.1*  HCT 32.7* 35.4*  MCV 92.4 88.9  PLT  311 286    Cardiac Enzymes: No results for input(s): CKTOTAL, CKMB, CKMBINDEX, TROPONINI in the last 168 hours.  BNP (last 3 results) No results for input(s): BNP in the last 8760 hours.  ProBNP (last 3 results) No results for input(s): PROBNP in the last 8760 hours.  Radiological Exams: No results found.  Assessment/Plan Active Problems:   Acute on chronic respiratory failure with hypoxia (HCC)   COVID-19 virus infection   Pneumonia due to COVID-19 virus   Acute respiratory distress syndrome (ARDS) due to COVID-19 virus (HCC)   1. Acute on chronic respiratory failure with hypoxia plan is to continue with T collar and PMV try to advance to capping 2. COVID-19 virus infection in recovery 3. Pneumonia due to COVID-19 treated 4. ARDS treated we will continue to follow along   I have personally seen and evaluated the patient, evaluated laboratory and imaging results, formulated the assessment and plan and placed orders. The Patient requires high complexity decision making with multiple systems involvement.  Rounds were done with the Respiratory Therapy Director and Staff therapists and discussed with nursing staff also.  Yevonne Pax, MD [facility] Pulmonary Critical Care Medicine Sleep  Medicine

## 2020-06-19 DIAGNOSIS — J80 Acute respiratory distress syndrome: Secondary | ICD-10-CM | POA: Diagnosis not present

## 2020-06-19 DIAGNOSIS — U071 COVID-19: Secondary | ICD-10-CM | POA: Diagnosis not present

## 2020-06-19 DIAGNOSIS — J9621 Acute and chronic respiratory failure with hypoxia: Secondary | ICD-10-CM | POA: Diagnosis not present

## 2020-06-19 DIAGNOSIS — Z9911 Dependence on respirator [ventilator] status: Secondary | ICD-10-CM | POA: Diagnosis not present

## 2020-06-19 LAB — TRIGLYCERIDES: Triglycerides: 112 mg/dL (ref <150)

## 2020-06-19 NOTE — Progress Notes (Signed)
Pulmonary Critical Care Medicine [facility] [place]   PULMONARY CRITICAL CARE SERVICE  PROGRESS NOTE  Date of Service: 06/19/2020  Anira Senegal  VPX:[id number]  DOB: 29-Mar-1956   DOA: 05/21/2020  Referring Physician: Carron Curie, MD  HPI: Andrea Fuller is a 65 y.o. female seen for follow up of Acute on Chronic Respiratory Failure.  She is off the ventilator doing well with T collar and now will be capping ready for advancement  Medications: Reviewed on Rounds  Physical Exam:  Vitals: Temperature is 97.8 pulse 71 respiratory 28 blood pressure is 128/48 saturations 97%  Ventilator Settings on T collar FiO2 28%  . General: Comfortable at this time . Eyes: Grossly normal lids, irises & conjunctiva . ENT: grossly tongue is normal . Neck: no obvious mass . Cardiovascular: S1 S2 normal no gallop . Respiratory: Scattered rhonchi expansion is equal . Abdomen: soft . Skin: no rash seen on limited exam . Musculoskeletal: not rigid . Psychiatric:unable to assess . Neurologic: no seizure no involuntary movements         Lab Data:   Basic Metabolic Panel: Recent Labs  Lab 06/14/20 0453 06/16/20 0422 06/17/20 0405  NA  --  141  --   K  --  3.8  --   CL  --  99  --   CO2  --  33*  --   GLUCOSE  --  104*  --   BUN  --  13  --   CREATININE  --  0.47  --   CALCIUM  --  8.9  --   MG 1.8 1.8 1.8  PHOS 4.1  --  4.3    ABG: Recent Labs  Lab 06/13/20 0814  PHART 7.428  PCO2ART 52.3*  PO2ART 75.7*  HCO3 34.2*  O2SAT 95.8    Liver Function Tests: No results for input(s): AST, ALT, ALKPHOS, BILITOT, PROT, ALBUMIN in the last 168 hours. No results for input(s): LIPASE, AMYLASE in the last 168 hours. No results for input(s): AMMONIA in the last 168 hours.  CBC: Recent Labs  Lab 06/16/20 0422  WBC 9.9  HGB 11.1*  HCT 35.4*  MCV 88.9  PLT 286    Cardiac Enzymes: No results for input(s): CKTOTAL, CKMB, CKMBINDEX, TROPONINI in the last 168 hours.  BNP  (last 3 results) No results for input(s): BNP in the last 8760 hours.  ProBNP (last 3 results) No results for input(s): PROBNP in the last 8760 hours.  Radiological Exams: No results found.  Assessment/Plan Active Problems:   Acute on chronic respiratory failure with hypoxia (HCC)   COVID-19 virus infection   Pneumonia due to COVID-19 virus   Acute respiratory distress syndrome (ARDS) due to COVID-19 virus (HCC)   1. Acute on chronic respiratory failure with hypoxia we will advance weaning to capping trials 2. COVID-19 virus infection recovery 3. Pneumonia due to COVID-19 treated 4. ARDS slow improvement   I have personally seen and evaluated the patient, evaluated laboratory and imaging results, formulated the assessment and plan and placed orders. The Patient requires high complexity decision making with multiple systems involvement.  Rounds were done with the Respiratory Therapy Director and Staff therapists and discussed with nursing staff also.  Yevonne Pax, MD [facility] Pulmonary Critical Care Medicine Sleep Medicine

## 2020-06-20 ENCOUNTER — Other Ambulatory Visit (HOSPITAL_COMMUNITY): Payer: Medicare HMO

## 2020-06-20 DIAGNOSIS — U071 COVID-19: Secondary | ICD-10-CM | POA: Diagnosis not present

## 2020-06-20 DIAGNOSIS — J80 Acute respiratory distress syndrome: Secondary | ICD-10-CM | POA: Diagnosis not present

## 2020-06-20 DIAGNOSIS — Z9911 Dependence on respirator [ventilator] status: Secondary | ICD-10-CM | POA: Diagnosis not present

## 2020-06-20 DIAGNOSIS — J9621 Acute and chronic respiratory failure with hypoxia: Secondary | ICD-10-CM | POA: Diagnosis not present

## 2020-06-20 LAB — CBC WITH DIFFERENTIAL/PLATELET
Abs Immature Granulocytes: 0.02 10*3/uL (ref 0.00–0.07)
Basophils Absolute: 0 10*3/uL (ref 0.0–0.1)
Basophils Relative: 0 %
Eosinophils Absolute: 0.3 10*3/uL (ref 0.0–0.5)
Eosinophils Relative: 5 %
HCT: 35.9 % — ABNORMAL LOW (ref 36.0–46.0)
Hemoglobin: 10.9 g/dL — ABNORMAL LOW (ref 12.0–15.0)
Immature Granulocytes: 0 %
Lymphocytes Relative: 25 %
Lymphs Abs: 1.7 10*3/uL (ref 0.7–4.0)
MCH: 27.5 pg (ref 26.0–34.0)
MCHC: 30.4 g/dL (ref 30.0–36.0)
MCV: 90.4 fL (ref 80.0–100.0)
Monocytes Absolute: 0.5 10*3/uL (ref 0.1–1.0)
Monocytes Relative: 8 %
Neutro Abs: 4.1 10*3/uL (ref 1.7–7.7)
Neutrophils Relative %: 62 %
Platelets: 213 10*3/uL (ref 150–400)
RBC: 3.97 MIL/uL (ref 3.87–5.11)
RDW: 18.4 % — ABNORMAL HIGH (ref 11.5–15.5)
WBC: 6.7 10*3/uL (ref 4.0–10.5)
nRBC: 0 % (ref 0.0–0.2)

## 2020-06-20 LAB — BASIC METABOLIC PANEL
Anion gap: 11 (ref 5–15)
BUN: 12 mg/dL (ref 8–23)
CO2: 27 mmol/L (ref 22–32)
Calcium: 8.9 mg/dL (ref 8.9–10.3)
Chloride: 102 mmol/L (ref 98–111)
Creatinine, Ser: 0.58 mg/dL (ref 0.44–1.00)
GFR, Estimated: >60 mL/min (ref >60)
Glucose, Bld: 94 mg/dL (ref 70–99)
Potassium: 3.6 mmol/L (ref 3.5–5.1)
Sodium: 140 mmol/L (ref 135–145)

## 2020-06-20 LAB — PHOSPHORUS: Phosphorus: 4.5 mg/dL (ref 2.5–4.6)

## 2020-06-20 LAB — MAGNESIUM: Magnesium: 2 mg/dL (ref 1.7–2.4)

## 2020-06-20 NOTE — Progress Notes (Signed)
Pulmonary Critical Care Medicine [facility] [place]   PULMONARY CRITICAL CARE SERVICE  PROGRESS NOTE  Date of Service: 06/20/2020  [name]  NAT:[id number]  DOB: 1955-11-16   DOA: 05/21/2020  Referring Physician: Carron Curie, MD  HPI: Andrea Fuller is a 65 y.o. female seen for follow up of Acute on Chronic Respiratory Failure.  Patient is capping right now for 48 hours  Medications: Reviewed on Rounds  Physical Exam:  Vitals: Temperature is 98.0 pulse 61 respiratory 20 blood pressure is 112/88 saturations 97%  Ventilator Settings on capping trials  . General: Comfortable at this time . Eyes: Grossly normal lids, irises & conjunctiva . ENT: grossly tongue is normal . Neck: no obvious mass . Cardiovascular: S1 S2 normal no gallop . Respiratory: Scattered rhonchi expansion is equal . Abdomen: soft . Skin: no rash seen on limited exam . Musculoskeletal: not rigid . Psychiatric:unable to assess . Neurologic: no seizure no involuntary movements         Lab Data:   Basic Metabolic Panel: Recent Labs  Lab 06/14/20 0453 06/16/20 0422 06/17/20 0405 06/20/20 0441  NA  --  141  --  140  K  --  3.8  --  3.6  CL  --  99  --  102  CO2  --  33*  --  27  GLUCOSE  --  104*  --  94  BUN  --  13  --  12  CREATININE  --  0.47  --  0.58  CALCIUM  --  8.9  --  8.9  MG 1.8 1.8 1.8 2.0  PHOS 4.1  --  4.3 4.5    ABG: No results for input(s): PHART, PCO2ART, PO2ART, HCO3, O2SAT in the last 168 hours.  Liver Function Tests: No results for input(s): AST, ALT, ALKPHOS, BILITOT, PROT, ALBUMIN in the last 168 hours. No results for input(s): LIPASE, AMYLASE in the last 168 hours. No results for input(s): AMMONIA in the last 168 hours.  CBC: Recent Labs  Lab 06/16/20 0422 06/20/20 0441  WBC 9.9 6.7  NEUTROABS  --  4.1  HGB 11.1* 10.9*  HCT 35.4* 35.9*  MCV 88.9 90.4  PLT 286 213    Cardiac Enzymes: No results for input(s): CKTOTAL, CKMB, CKMBINDEX, TROPONINI  in the last 168 hours.  BNP (last 3 results) No results for input(s): BNP in the last 8760 hours.  ProBNP (last 3 results) No results for input(s): PROBNP in the last 8760 hours.  Radiological Exams: No results found.  Assessment/Plan Active Problems:   Acute on chronic respiratory failure with hypoxia (HCC)   COVID-19 virus infection   Pneumonia due to COVID-19 virus   Acute respiratory distress syndrome (ARDS) due to COVID-19 virus (HCC)   1. Acute on chronic respiratory failure with hypoxia doing fine we will continue to advance the weaning on capping 2. COVID-19 virus infection recovery we will continue with supportive care 3. ARDS treated slowly improving   I have personally seen and evaluated the patient, evaluated laboratory and imaging results, formulated the assessment and plan and placed orders. The Patient requires high complexity decision making with multiple systems involvement.  Rounds were done with the Respiratory Therapy Director and Staff therapists and discussed with nursing staff also.  Yevonne Pax, MD [facility] Pulmonary Critical Care Medicine Sleep Medicine

## 2020-06-21 DIAGNOSIS — J80 Acute respiratory distress syndrome: Secondary | ICD-10-CM | POA: Diagnosis not present

## 2020-06-21 DIAGNOSIS — J9621 Acute and chronic respiratory failure with hypoxia: Secondary | ICD-10-CM | POA: Diagnosis not present

## 2020-06-21 DIAGNOSIS — U071 COVID-19: Secondary | ICD-10-CM | POA: Diagnosis not present

## 2020-06-21 DIAGNOSIS — Z9911 Dependence on respirator [ventilator] status: Secondary | ICD-10-CM | POA: Diagnosis not present

## 2020-06-21 MED ORDER — IOHEXOL 300 MG/ML  SOLN
100.0000 mL | Freq: Once | INTRAMUSCULAR | Status: AC | PRN
  Administered 2020-06-21: 100 mL via INTRAVENOUS

## 2020-06-21 NOTE — Progress Notes (Signed)
Pulmonary Critical Care Medicine [facility] [place]   PULMONARY CRITICAL CARE SERVICE  PROGRESS NOTE  Date of Service: 06/21/2020  Andrea Fuller  ZOX:[id number]  DOB: 1955/06/24   DOA: 05/21/2020  Referring Physician: Carron Curie, MD  HPI: Andrea Fuller is a 65 y.o. female seen for follow up of Acute on Chronic Respiratory Failure.  Patient currently is capping on Ceftin capping now doing well no secretions  Medications: Reviewed on Rounds  Physical Exam:  Vitals: Temperature is 96.1 pulse 52 respiratory 30 blood pressure is 137/74 saturations 99%  Ventilator Settings capping off the ventilator  . General: Comfortable at this time . Eyes: Grossly normal lids, irises & conjunctiva . ENT: grossly tongue is normal . Neck: no obvious mass . Cardiovascular: S1 S2 normal no gallop . Respiratory: No rhonchi coarse breath sounds . Abdomen: soft . Skin: no rash seen on limited exam . Musculoskeletal: not rigid . Psychiatric:unable to assess . Neurologic: no seizure no involuntary movements         Lab Data:   Basic Metabolic Panel: Recent Labs  Lab 06/16/20 0422 06/17/20 0405 06/20/20 0441  NA 141  --  140  K 3.8  --  3.6  CL 99  --  102  CO2 33*  --  27  GLUCOSE 104*  --  94  BUN 13  --  12  CREATININE 0.47  --  0.58  CALCIUM 8.9  --  8.9  MG 1.8 1.8 2.0  PHOS  --  4.3 4.5    ABG: No results for input(s): PHART, PCO2ART, PO2ART, HCO3, O2SAT in the last 168 hours.  Liver Function Tests: No results for input(s): AST, ALT, ALKPHOS, BILITOT, PROT, ALBUMIN in the last 168 hours. No results for input(s): LIPASE, AMYLASE in the last 168 hours. No results for input(s): AMMONIA in the last 168 hours.  CBC: Recent Labs  Lab 06/16/20 0422 06/20/20 0441  WBC 9.9 6.7  NEUTROABS  --  4.1  HGB 11.1* 10.9*  HCT 35.4* 35.9*  MCV 88.9 90.4  PLT 286 213    Cardiac Enzymes: No results for input(s): CKTOTAL, CKMB, CKMBINDEX, TROPONINI in the last 168  hours.  BNP (last 3 results) No results for input(s): BNP in the last 8760 hours.  ProBNP (last 3 results) No results for input(s): PROBNP in the last 8760 hours.  Radiological Exams: CT ABDOMEN W CONTRAST  Result Date: 06/21/2020 CLINICAL DATA:  Intra-abdominal abscess. EXAM: CT ABDOMEN WITH CONTRAST TECHNIQUE: Multidetector CT imaging of the abdomen was performed using the standard protocol following bolus administration of intravenous contrast. CONTRAST:  OMNIPAQUE IOHEXOL 300 MG/ML  SOLN COMPARISON:  May 27, 2020 FINDINGS: Lower chest: Mild areas of atelectasis and/or infiltrate are seen within the bilateral lung bases. Small bilateral pleural effusions are noted. Hepatobiliary: No focal liver abnormality is seen. No gallstones, gallbladder wall thickening, or biliary dilatation. Pancreas: Unremarkable. No pancreatic ductal dilatation or surrounding inflammatory changes. Spleen: Normal in size without focal abnormality. Adrenals/Urinary Tract: Adrenal glands are unremarkable. Kidneys are normal, without renal calculi, focal lesion, or hydronephrosis. Stomach/Bowel: Stomach is within normal limits. Appendix appears normal. No evidence of bowel wall thickening, distention, or inflammatory changes. Vascular/Lymphatic: Mild aortic atherosclerosis. No enlarged abdominal or pelvic lymph nodes. Other: Marked severity subcutaneous inflammatory fat stranding is seen within the anterior aspect of the mid to upper abdominal wall. A moderate amount of soft tissue air is also seen within this region. There is no evidence of an organized fluid collection  or abscess. There is no evidence of intra-abdominal free fluid or intra-abdominal free air. Musculoskeletal: Multilevel degenerative changes seen throughout the lumbar spine. IMPRESSION: Marked severity cellulitis within the anterior aspect of the mid to upper abdominal wall, without evidence of an organized fluid collection or abscess. Aortic  Atherosclerosis (ICD10-I70.0). Electronically Signed   By: Aram Candela M.D.   On: 06/21/2020 00:10    Assessment/Plan Active Problems:   Acute on chronic respiratory failure with hypoxia (HCC)   COVID-19 virus infection   Pneumonia due to COVID-19 virus   Acute respiratory distress syndrome (ARDS) due to COVID-19 virus (HCC)   1. Acute on chronic respiratory failure hypoxia we will continue with capping trials patient is completed 48 hours ready for decannulation 2. COVID-19 virus infection recovery phase 3. Pneumonia due to COVID-19 treated we will continue to follow 4. ARDS treated slowly improving   I have personally seen and evaluated the patient, evaluated laboratory and imaging results, formulated the assessment and plan and placed orders. The Patient requires high complexity decision making with multiple systems involvement.  Rounds were done with the Respiratory Therapy Director and Staff therapists and discussed with nursing staff also.  Yevonne Pax, MD [facility] Pulmonary Critical Care Medicine Sleep Medicine

## 2020-06-23 LAB — PHOSPHORUS: Phosphorus: 3.6 mg/dL (ref 2.5–4.6)

## 2020-06-23 LAB — MAGNESIUM: Magnesium: 1.9 mg/dL (ref 1.7–2.4)

## 2020-06-24 LAB — BASIC METABOLIC PANEL
Anion gap: 10 (ref 5–15)
BUN: 16 mg/dL (ref 8–23)
CO2: 26 mmol/L (ref 22–32)
Calcium: 8.7 mg/dL — ABNORMAL LOW (ref 8.9–10.3)
Chloride: 105 mmol/L (ref 98–111)
Creatinine, Ser: 0.78 mg/dL (ref 0.44–1.00)
GFR, Estimated: >60 mL/min (ref >60)
Glucose, Bld: 102 mg/dL — ABNORMAL HIGH (ref 70–99)
Potassium: 3.9 mmol/L (ref 3.5–5.1)
Sodium: 141 mmol/L (ref 135–145)

## 2020-06-24 LAB — CBC
HCT: 37 % (ref 36.0–46.0)
Hemoglobin: 11.7 g/dL — ABNORMAL LOW (ref 12.0–15.0)
MCH: 27.9 pg (ref 26.0–34.0)
MCHC: 31.6 g/dL (ref 30.0–36.0)
MCV: 88.1 fL (ref 80.0–100.0)
Platelets: 240 10*3/uL (ref 150–400)
RBC: 4.2 MIL/uL (ref 3.87–5.11)
RDW: 17.6 % — ABNORMAL HIGH (ref 11.5–15.5)
WBC: 8 10*3/uL (ref 4.0–10.5)
nRBC: 0 % (ref 0.0–0.2)

## 2020-06-24 LAB — MAGNESIUM: Magnesium: 1.9 mg/dL (ref 1.7–2.4)

## 2020-06-25 LAB — CBC
HCT: 36.9 % (ref 36.0–46.0)
Hemoglobin: 11.8 g/dL — ABNORMAL LOW (ref 12.0–15.0)
MCH: 28 pg (ref 26.0–34.0)
MCHC: 32 g/dL (ref 30.0–36.0)
MCV: 87.4 fL (ref 80.0–100.0)
Platelets: 221 10*3/uL (ref 150–400)
RBC: 4.22 MIL/uL (ref 3.87–5.11)
RDW: 17.5 % — ABNORMAL HIGH (ref 11.5–15.5)
WBC: 9.3 10*3/uL (ref 4.0–10.5)
nRBC: 0 % (ref 0.0–0.2)

## 2020-06-25 LAB — MAGNESIUM: Magnesium: 1.8 mg/dL (ref 1.7–2.4)

## 2020-06-25 LAB — BASIC METABOLIC PANEL
Anion gap: 12 (ref 5–15)
BUN: 13 mg/dL (ref 8–23)
CO2: 27 mmol/L (ref 22–32)
Calcium: 8.9 mg/dL (ref 8.9–10.3)
Chloride: 101 mmol/L (ref 98–111)
Creatinine, Ser: 0.53 mg/dL (ref 0.44–1.00)
GFR, Estimated: >60 mL/min (ref >60)
Glucose, Bld: 92 mg/dL (ref 70–99)
Potassium: 4.5 mmol/L (ref 3.5–5.1)
Sodium: 140 mmol/L (ref 135–145)

## 2020-06-28 LAB — CBC
HCT: 38.4 % (ref 36.0–46.0)
Hemoglobin: 11.5 g/dL — ABNORMAL LOW (ref 12.0–15.0)
MCH: 26.5 pg (ref 26.0–34.0)
MCHC: 29.9 g/dL — ABNORMAL LOW (ref 30.0–36.0)
MCV: 88.5 fL (ref 80.0–100.0)
Platelets: 217 10*3/uL (ref 150–400)
RBC: 4.34 MIL/uL (ref 3.87–5.11)
RDW: 17.2 % — ABNORMAL HIGH (ref 11.5–15.5)
WBC: 8.6 10*3/uL (ref 4.0–10.5)
nRBC: 0 % (ref 0.0–0.2)

## 2020-06-28 LAB — BASIC METABOLIC PANEL
Anion gap: 8 (ref 5–15)
BUN: 19 mg/dL (ref 8–23)
CO2: 30 mmol/L (ref 22–32)
Calcium: 9.1 mg/dL (ref 8.9–10.3)
Chloride: 103 mmol/L (ref 98–111)
Creatinine, Ser: 0.51 mg/dL (ref 0.44–1.00)
GFR, Estimated: >60 mL/min (ref >60)
Glucose, Bld: 108 mg/dL — ABNORMAL HIGH (ref 70–99)
Potassium: 3.8 mmol/L (ref 3.5–5.1)
Sodium: 141 mmol/L (ref 135–145)

## 2020-06-28 LAB — MAGNESIUM: Magnesium: 1.8 mg/dL (ref 1.7–2.4)

## 2020-07-01 LAB — BASIC METABOLIC PANEL
Anion gap: 12 (ref 5–15)
BUN: 24 mg/dL — ABNORMAL HIGH (ref 8–23)
CO2: 28 mmol/L (ref 22–32)
Calcium: 9.3 mg/dL (ref 8.9–10.3)
Chloride: 100 mmol/L (ref 98–111)
Creatinine, Ser: 0.57 mg/dL (ref 0.44–1.00)
GFR, Estimated: >60 mL/min (ref >60)
Glucose, Bld: 101 mg/dL — ABNORMAL HIGH (ref 70–99)
Potassium: 4.3 mmol/L (ref 3.5–5.1)
Sodium: 140 mmol/L (ref 135–145)

## 2020-07-01 LAB — MAGNESIUM: Magnesium: 2 mg/dL (ref 1.7–2.4)

## 2020-07-01 LAB — CBC
HCT: 36.4 % (ref 36.0–46.0)
Hemoglobin: 11.4 g/dL — ABNORMAL LOW (ref 12.0–15.0)
MCH: 27.7 pg (ref 26.0–34.0)
MCHC: 31.3 g/dL (ref 30.0–36.0)
MCV: 88.3 fL (ref 80.0–100.0)
Platelets: 211 10*3/uL (ref 150–400)
RBC: 4.12 MIL/uL (ref 3.87–5.11)
RDW: 16.9 % — ABNORMAL HIGH (ref 11.5–15.5)
WBC: 9.7 10*3/uL (ref 4.0–10.5)
nRBC: 0 % (ref 0.0–0.2)

## 2020-07-01 LAB — PHOSPHORUS: Phosphorus: 4.2 mg/dL (ref 2.5–4.6)

## 2020-07-03 LAB — NOVEL CORONAVIRUS, NAA (HOSP ORDER, SEND-OUT TO REF LAB; TAT 18-24 HRS): SARS-CoV-2, NAA: NOT DETECTED

## 2020-07-05 LAB — BASIC METABOLIC PANEL
Anion gap: 11 (ref 5–15)
BUN: 15 mg/dL (ref 8–23)
CO2: 30 mmol/L (ref 22–32)
Calcium: 9.6 mg/dL (ref 8.9–10.3)
Chloride: 99 mmol/L (ref 98–111)
Creatinine, Ser: 0.52 mg/dL (ref 0.44–1.00)
GFR, Estimated: >60 mL/min (ref >60)
Glucose, Bld: 95 mg/dL (ref 70–99)
Potassium: 3.8 mmol/L (ref 3.5–5.1)
Sodium: 140 mmol/L (ref 135–145)

## 2020-07-05 LAB — CBC
HCT: 38.3 % (ref 36.0–46.0)
Hemoglobin: 11.4 g/dL — ABNORMAL LOW (ref 12.0–15.0)
MCH: 26.5 pg (ref 26.0–34.0)
MCHC: 29.8 g/dL — ABNORMAL LOW (ref 30.0–36.0)
MCV: 88.9 fL (ref 80.0–100.0)
Platelets: 213 10*3/uL (ref 150–400)
RBC: 4.31 MIL/uL (ref 3.87–5.11)
RDW: 15.9 % — ABNORMAL HIGH (ref 11.5–15.5)
WBC: 8.8 10*3/uL (ref 4.0–10.5)
nRBC: 0 % (ref 0.0–0.2)

## 2020-07-05 LAB — MAGNESIUM: Magnesium: 1.8 mg/dL (ref 1.7–2.4)

## 2020-07-08 LAB — NOVEL CORONAVIRUS, NAA (HOSP ORDER, SEND-OUT TO REF LAB; TAT 18-24 HRS): SARS-CoV-2, NAA: NOT DETECTED

## 2021-12-21 IMAGING — DX DG CHEST 1V PORT
1 series · 1 of 1 positions shown · non-contrast
Comparison: 05/24/2020

CLINICAL DATA: Tachycardia

EXAM:
PORTABLE CHEST 1 VIEW

[chest ap]
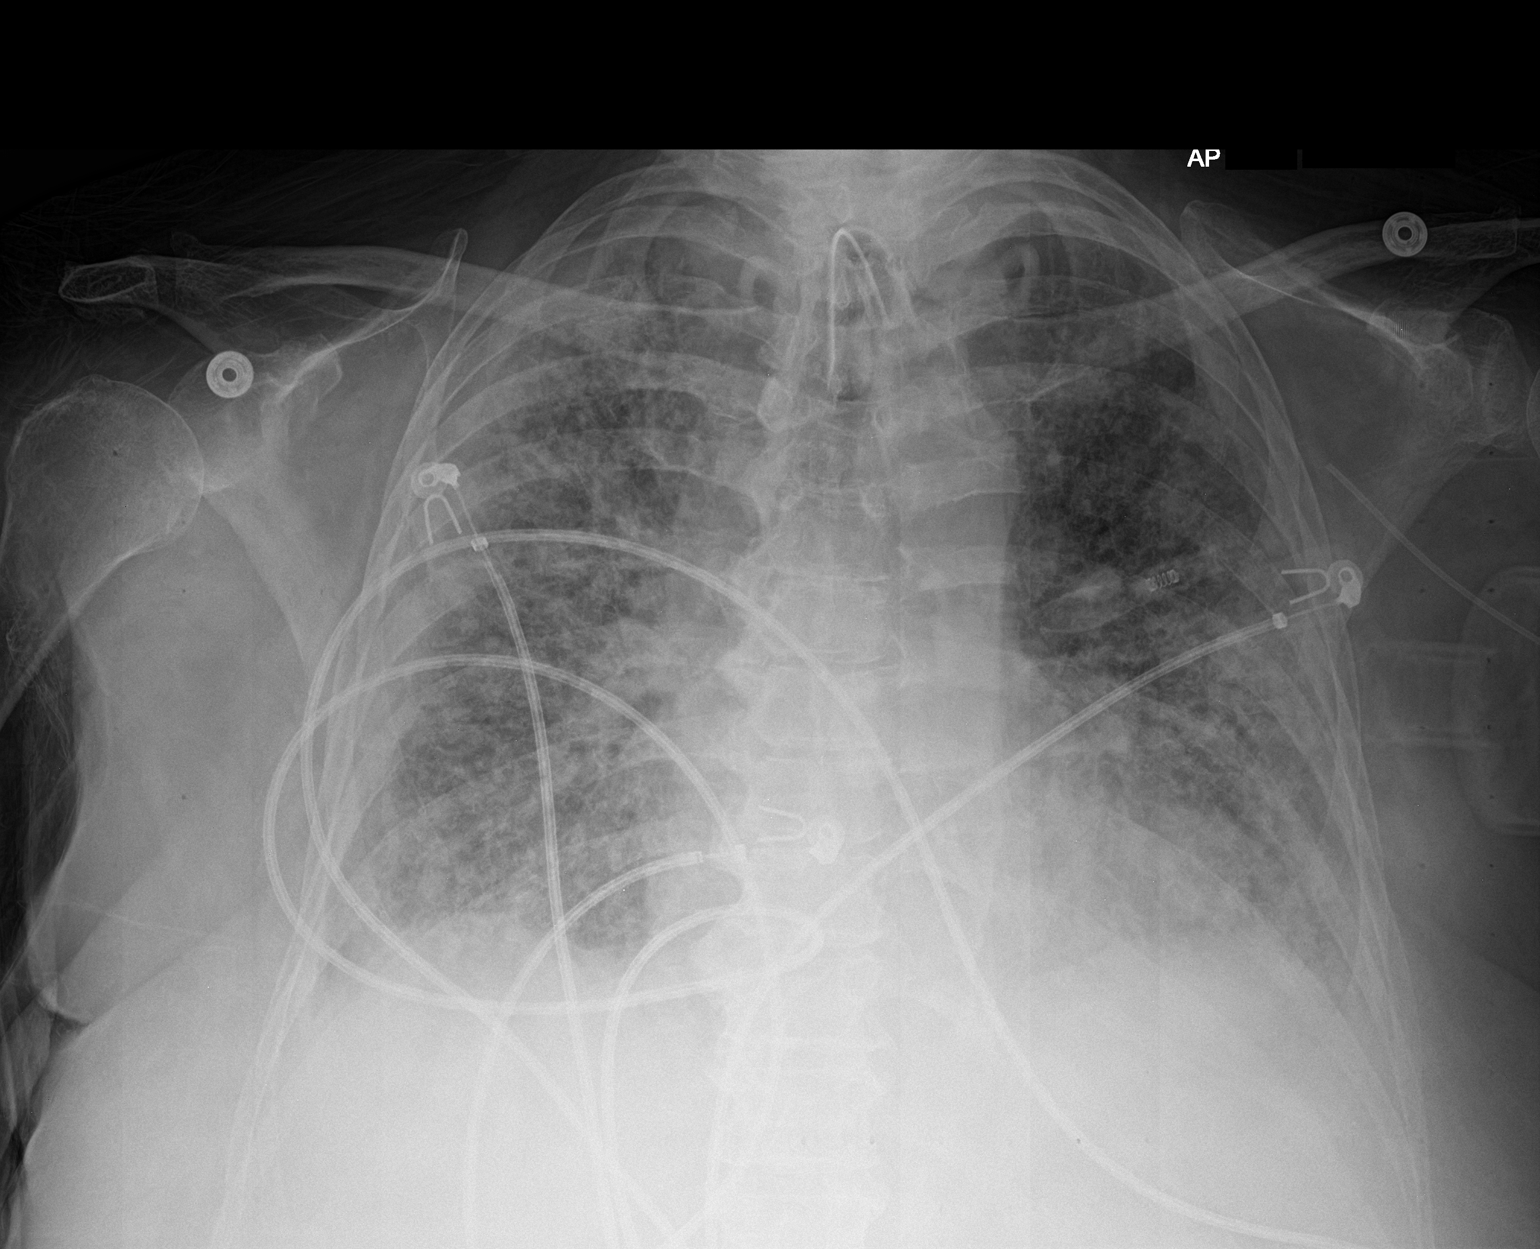

[1 of 1 positions shown; findings below may reference images not displayed]

FINDINGS: Tracheostomy tube in good position. Normal cardiac silhouette.
Diffuse bilateral airspace disease. No focal consolidation. No
pleural fluid or pneumothorax.
IMPRESSION: No change in diffuse airspace disease.

## 2021-12-23 IMAGING — DX DG CHEST 1V PORT
1 series · 1 of 1 positions shown · non-contrast
Comparison: May 31, 2020.

CLINICAL DATA: Pneumonia.

EXAM:
PORTABLE CHEST 1 VIEW

[chest ap]
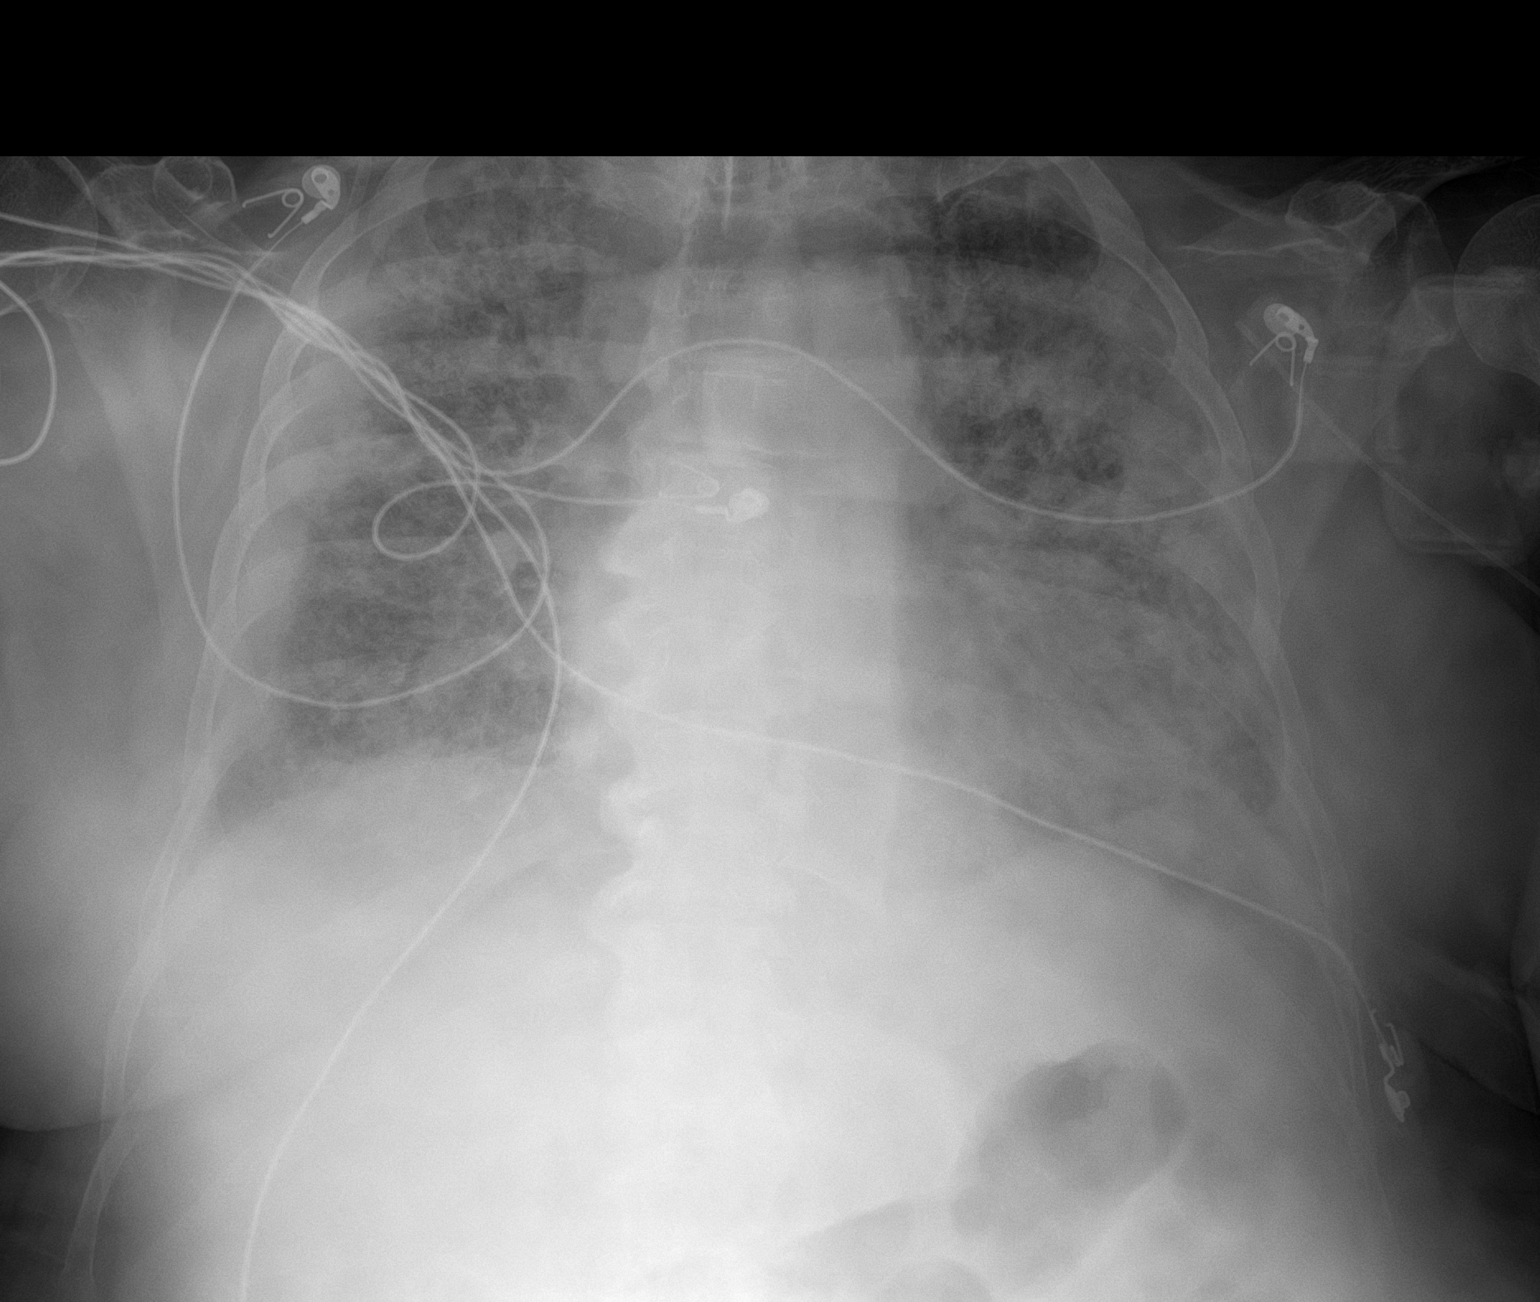

[1 of 1 positions shown; findings below may reference images not displayed]

FINDINGS: Stable cardiomediastinal silhouette. Tracheostomy tube is unchanged
in position. No pneumothorax or pleural effusion is noted. Stable
bilateral lung opacities are noted concerning for multifocal
pneumonia. Bony thorax is unremarkable.
IMPRESSION: Stable bilateral lung opacities are noted concerning for multifocal
pneumonia.

## 2021-12-24 IMAGING — DX DG CHEST 1V PORT
1 series · 1 of 1 positions shown · non-contrast
Comparison: Yesterday

CLINICAL DATA: Pneumonia.

EXAM:
PORTABLE CHEST 1 VIEW

[chest ap]
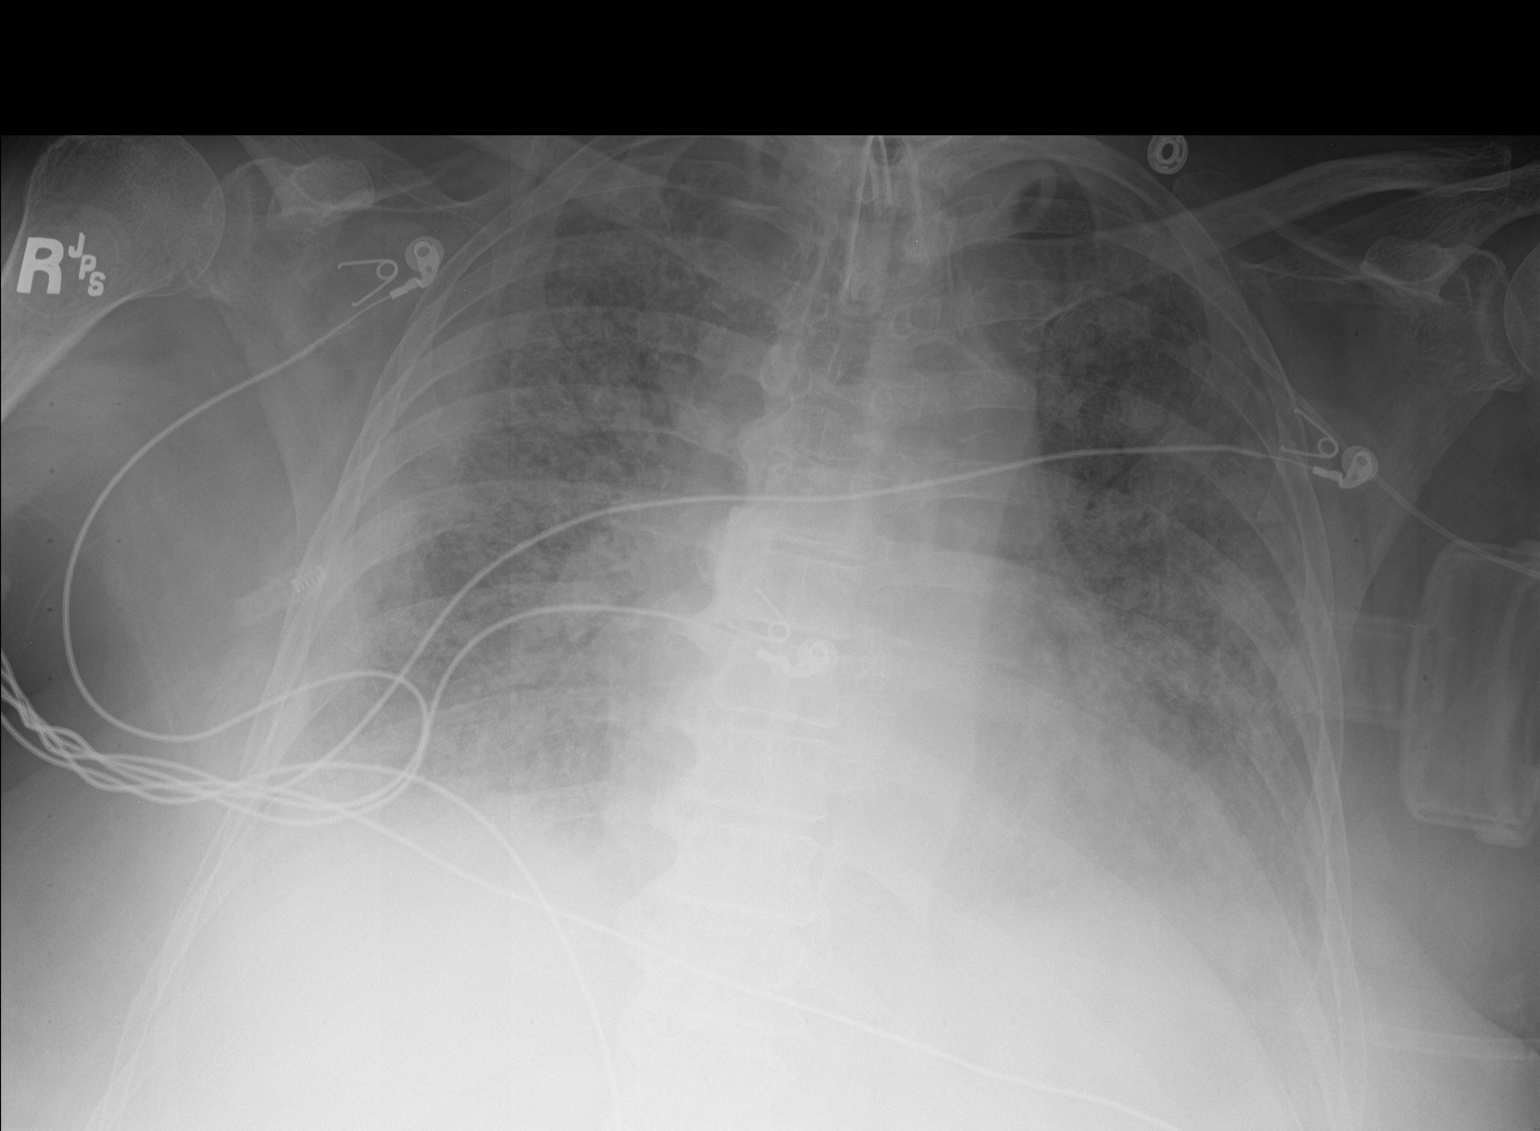

[1 of 1 positions shown; findings below may reference images not displayed]

FINDINGS: Tracheostomy tube in place. Confluent bilateral airspace disease.
Negative for air leak. Stable cardiomegaly accentuated by technique.
IMPRESSION: Stable confluent pneumonia.

## 2021-12-24 IMAGING — DX DG ABD PORTABLE 1V
1 series · 1 of 1 positions shown · non-contrast
Comparison: Earlier same day

CLINICAL DATA: NG tube placement

EXAM:
PORTABLE ABDOMEN - 1 VIEW

[abdomen]
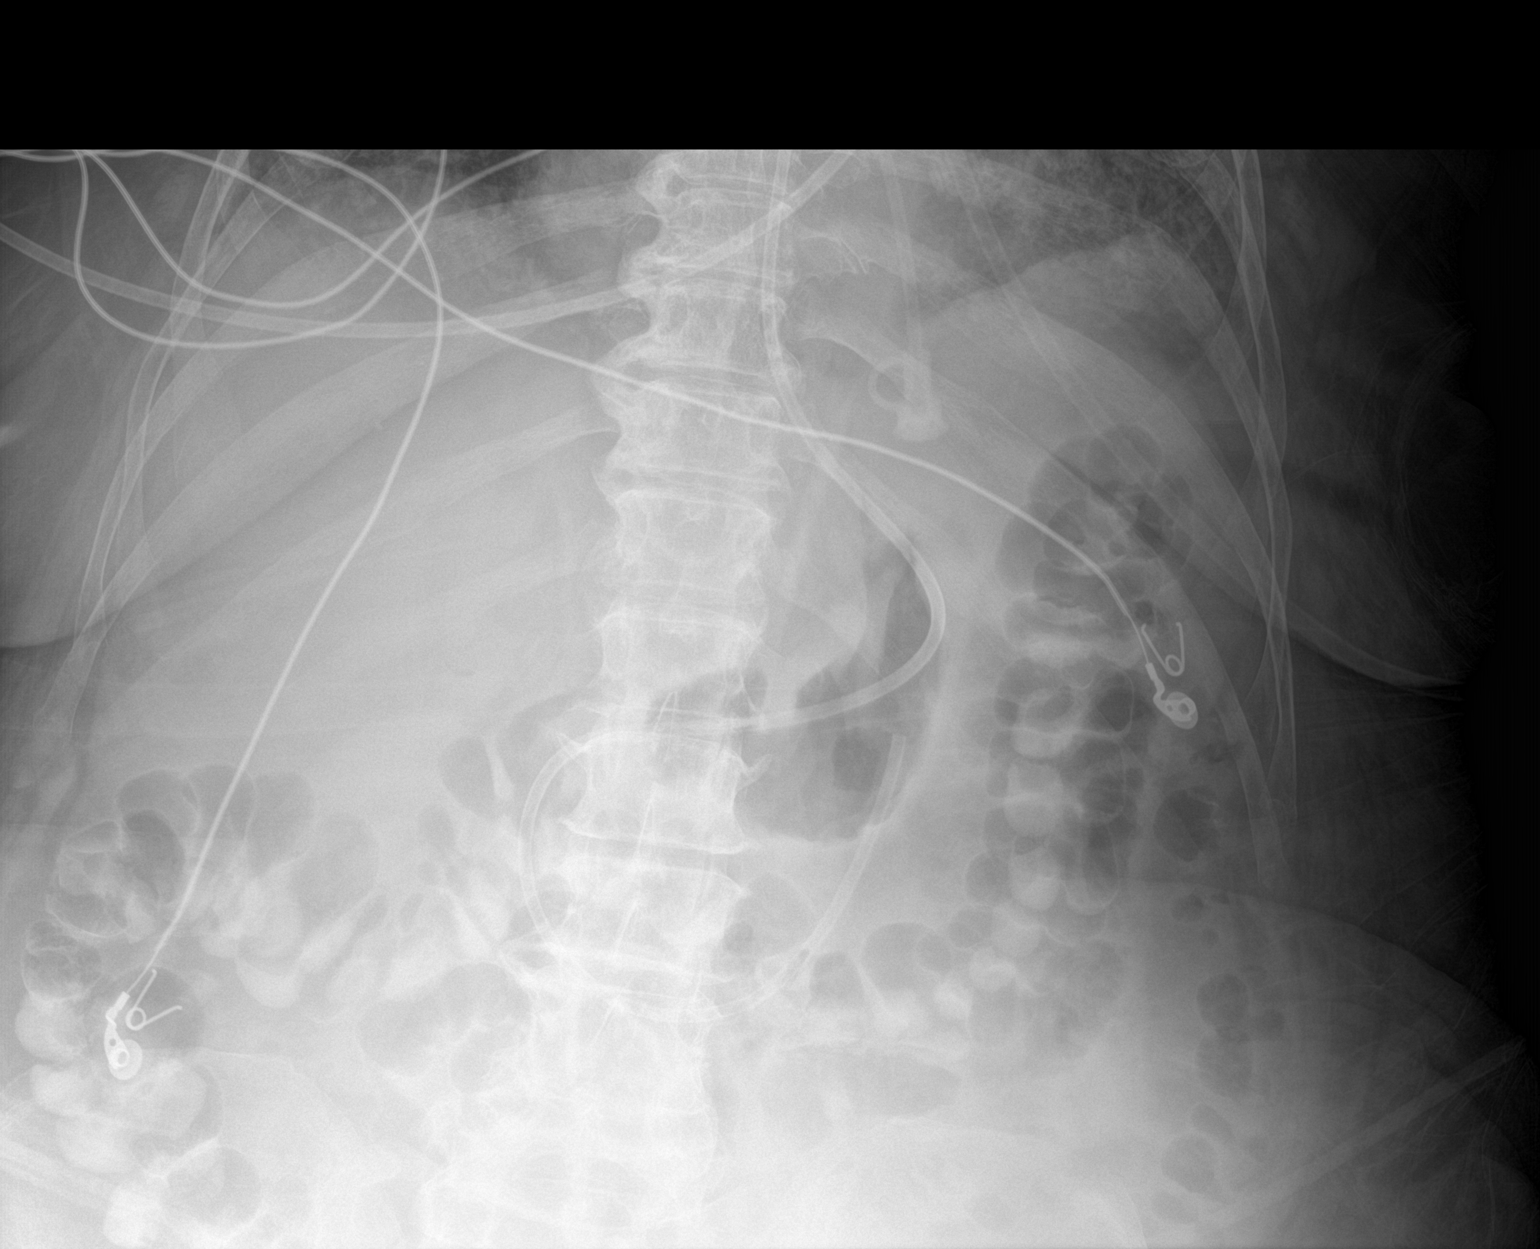

[1 of 1 positions shown; findings below may reference images not displayed]

FINDINGS: Enteric tube has been pulled back and now passes through the stomach
to the ligament of Treitz.
IMPRESSION: Enteric tube tip at the ligament of Treitz.

## 2021-12-27 IMAGING — DX DG CHEST 1V PORT
1 series · 1 of 1 positions shown · non-contrast
Comparison: 05/04/2020

CLINICAL DATA: Pneumonia

EXAM:
PORTABLE CHEST 1 VIEW

[chest ap]
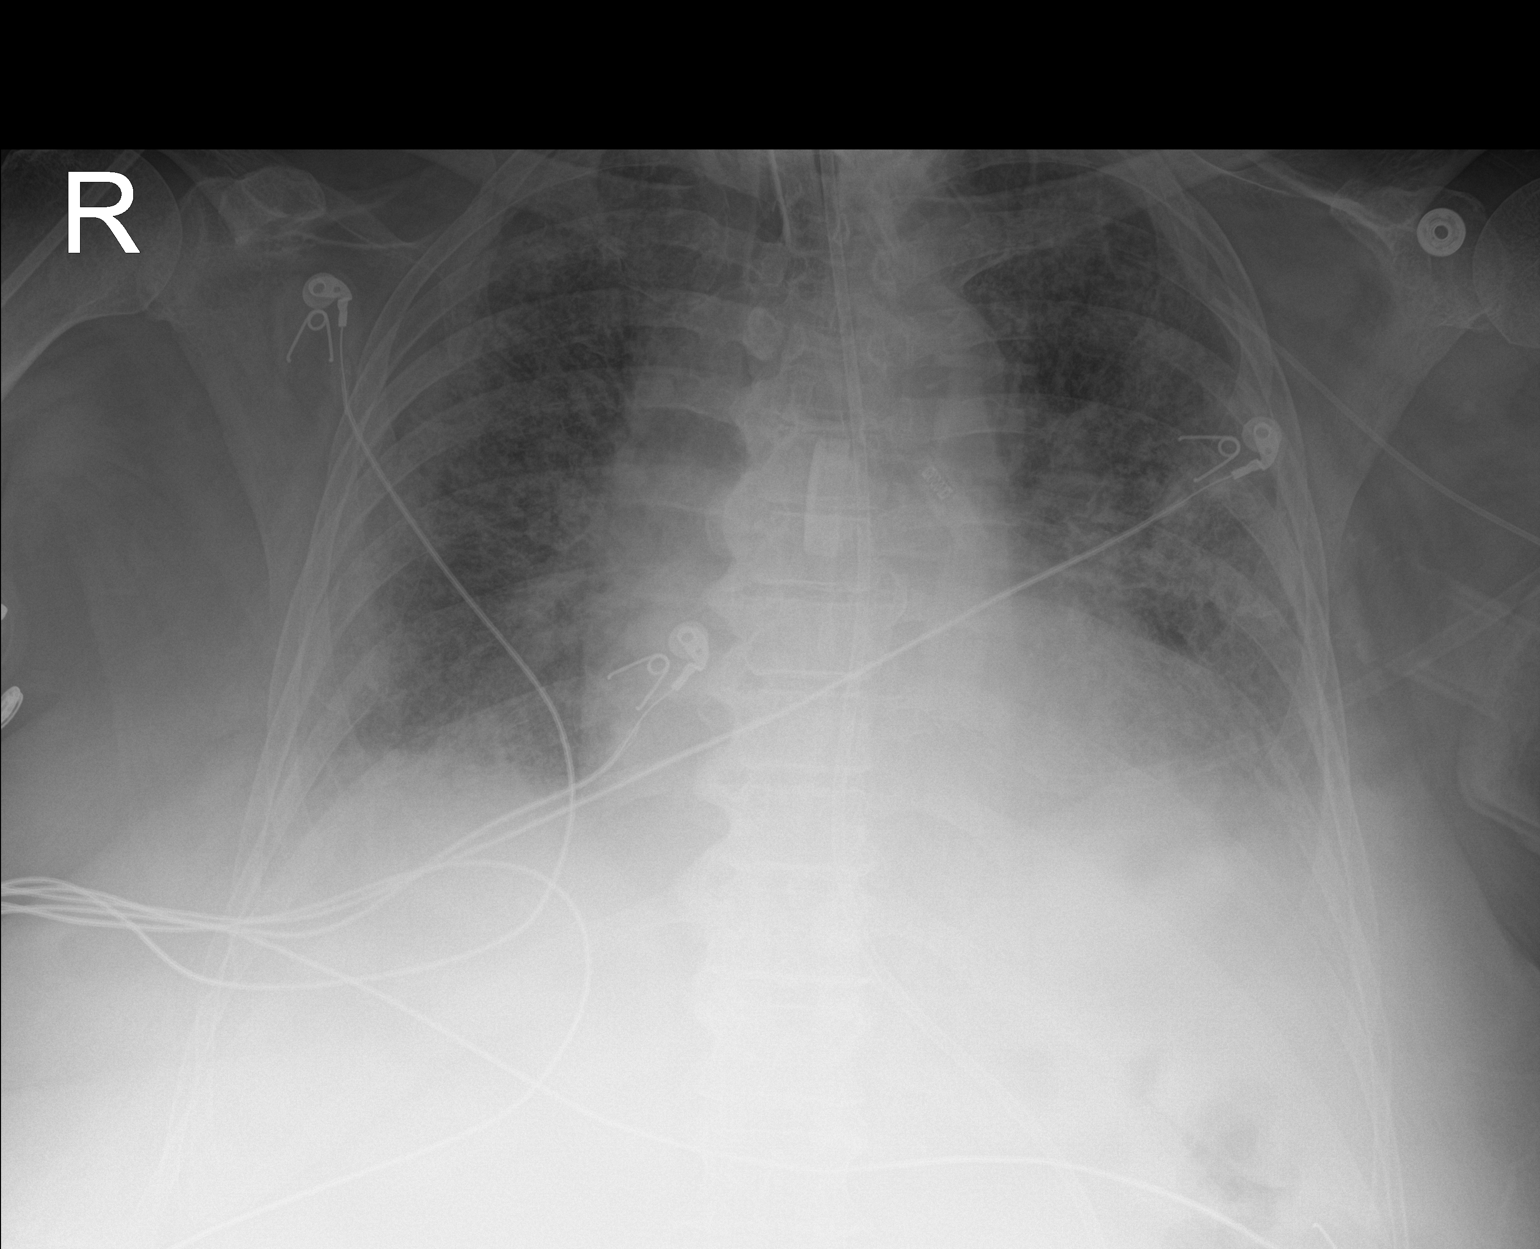

[1 of 1 positions shown; findings below may reference images not displayed]

FINDINGS: Tracheostomy and feeding tubes in unremarkable position. Confluent
pulmonary opacity is unchanged. Apparent cardiomegaly. No visible
effusion or pneumothorax.
IMPRESSION: Unremarkable hardware positioning.  Stable confluent infiltrates.
